# Patient Record
Sex: Female | Born: 1945 | Race: White | Hispanic: No | Marital: Married | Smoking: Never smoker
Health system: Southern US, Community
[De-identification: ages and names within clinical notes are randomized; demographics above are authoritative.]

## PROBLEM LIST (undated history)

## (undated) DIAGNOSIS — D329 Benign neoplasm of meninges, unspecified: Secondary | ICD-10-CM

## (undated) DIAGNOSIS — E785 Hyperlipidemia, unspecified: Secondary | ICD-10-CM

## (undated) DIAGNOSIS — IMO0001 Reserved for inherently not codable concepts without codable children: Secondary | ICD-10-CM

## (undated) DIAGNOSIS — B958 Unspecified staphylococcus as the cause of diseases classified elsewhere: Secondary | ICD-10-CM

## (undated) DIAGNOSIS — A0472 Enterocolitis due to Clostridium difficile, not specified as recurrent: Secondary | ICD-10-CM

## (undated) HISTORY — DX: Benign neoplasm of meninges, unspecified: D32.9

## (undated) HISTORY — DX: Hyperlipidemia, unspecified: E78.5

## (undated) HISTORY — DX: Unspecified staphylococcus as the cause of diseases classified elsewhere: B95.8

## (undated) HISTORY — PX: ATRIAL ABLATION SURGERY: SHX560

## (undated) HISTORY — PX: BREAST SURGERY: SHX581

## (undated) HISTORY — DX: Reserved for inherently not codable concepts without codable children: IMO0001

## (undated) HISTORY — DX: Enterocolitis due to Clostridium difficile, not specified as recurrent: A04.72

---

## 2005-04-13 ENCOUNTER — Encounter: Payer: Self-pay | Admitting: Family Medicine

## 2005-07-09 ENCOUNTER — Ambulatory Visit: Payer: Self-pay | Admitting: Family Medicine

## 2005-07-24 ENCOUNTER — Ambulatory Visit: Payer: Self-pay | Admitting: Family Medicine

## 2005-07-30 ENCOUNTER — Encounter: Payer: Self-pay | Admitting: Family Medicine

## 2005-09-10 DIAGNOSIS — IMO0001 Reserved for inherently not codable concepts without codable children: Secondary | ICD-10-CM

## 2005-09-10 HISTORY — DX: Reserved for inherently not codable concepts without codable children: IMO0001

## 2006-06-18 DIAGNOSIS — IMO0002 Reserved for concepts with insufficient information to code with codable children: Secondary | ICD-10-CM | POA: Insufficient documentation

## 2006-06-18 DIAGNOSIS — M1710 Unilateral primary osteoarthritis, unspecified knee: Secondary | ICD-10-CM

## 2006-06-18 DIAGNOSIS — G43909 Migraine, unspecified, not intractable, without status migrainosus: Secondary | ICD-10-CM | POA: Insufficient documentation

## 2006-07-08 ENCOUNTER — Encounter: Payer: Self-pay | Admitting: Family Medicine

## 2006-07-08 DIAGNOSIS — M8580 Other specified disorders of bone density and structure, unspecified site: Secondary | ICD-10-CM | POA: Insufficient documentation

## 2006-07-08 DIAGNOSIS — M949 Disorder of cartilage, unspecified: Secondary | ICD-10-CM

## 2006-07-08 DIAGNOSIS — E7800 Pure hypercholesterolemia, unspecified: Secondary | ICD-10-CM | POA: Insufficient documentation

## 2006-07-08 DIAGNOSIS — M899 Disorder of bone, unspecified: Secondary | ICD-10-CM

## 2006-08-13 ENCOUNTER — Encounter: Payer: Self-pay | Admitting: Family Medicine

## 2006-08-13 ENCOUNTER — Ambulatory Visit: Payer: Self-pay | Admitting: Family Medicine

## 2006-08-13 ENCOUNTER — Other Ambulatory Visit: Admission: RE | Admit: 2006-08-13 | Discharge: 2006-08-13 | Payer: Self-pay | Admitting: Family Medicine

## 2006-08-13 DIAGNOSIS — M25559 Pain in unspecified hip: Secondary | ICD-10-CM | POA: Insufficient documentation

## 2006-08-13 DIAGNOSIS — M461 Sacroiliitis, not elsewhere classified: Secondary | ICD-10-CM | POA: Insufficient documentation

## 2006-08-13 DIAGNOSIS — L659 Nonscarring hair loss, unspecified: Secondary | ICD-10-CM | POA: Insufficient documentation

## 2006-08-14 LAB — CONVERTED CEMR LAB
HDL: 55 mg/dL (ref >39)
LDL Cholesterol: 107 mg/dL — ABNORMAL HIGH (ref 0–99)
TSH: 1.587 microintl units/mL (ref 0.350–5.50)
Total CHOL/HDL Ratio: 3.4
Triglycerides: 126 mg/dL (ref <150)

## 2006-10-04 ENCOUNTER — Telehealth: Payer: Self-pay | Admitting: Family Medicine

## 2006-10-07 ENCOUNTER — Telehealth (INDEPENDENT_AMBULATORY_CARE_PROVIDER_SITE_OTHER): Payer: Self-pay | Admitting: *Deleted

## 2006-10-07 DIAGNOSIS — R928 Other abnormal and inconclusive findings on diagnostic imaging of breast: Secondary | ICD-10-CM | POA: Insufficient documentation

## 2006-10-22 ENCOUNTER — Encounter: Payer: Self-pay | Admitting: Family Medicine

## 2007-09-15 ENCOUNTER — Encounter: Payer: Self-pay | Admitting: Family Medicine

## 2007-09-15 ENCOUNTER — Encounter: Admission: RE | Admit: 2007-09-15 | Discharge: 2007-09-15 | Payer: Self-pay | Admitting: Family Medicine

## 2007-09-15 ENCOUNTER — Other Ambulatory Visit: Admission: RE | Admit: 2007-09-15 | Discharge: 2007-09-15 | Payer: Self-pay | Admitting: Family Medicine

## 2007-09-15 ENCOUNTER — Ambulatory Visit: Payer: Self-pay | Admitting: Family Medicine

## 2007-09-15 LAB — CONVERTED CEMR LAB
ALT: 30 units/L (ref 0–35)
AST: 24 units/L (ref 0–37)
BUN: 22 mg/dL (ref 6–23)
CO2: 19 meq/L (ref 19–32)
Calcium: 9.6 mg/dL (ref 8.4–10.5)
Chloride: 109 meq/L (ref 96–112)
Cholesterol: 174 mg/dL (ref 0–200)
Creatinine, Ser: 0.89 mg/dL (ref 0.40–1.20)
HDL: 55 mg/dL (ref >39)
Total Bilirubin: 0.5 mg/dL (ref 0.3–1.2)
Total CHOL/HDL Ratio: 3.2
VLDL: 22 mg/dL (ref 0–40)

## 2007-09-16 ENCOUNTER — Encounter: Payer: Self-pay | Admitting: Family Medicine

## 2007-12-08 ENCOUNTER — Telehealth: Payer: Self-pay | Admitting: Family Medicine

## 2008-10-10 ENCOUNTER — Encounter: Payer: Self-pay | Admitting: Family Medicine

## 2008-10-18 ENCOUNTER — Encounter: Payer: Self-pay | Admitting: Family Medicine

## 2008-10-26 ENCOUNTER — Encounter: Payer: Self-pay | Admitting: Family Medicine

## 2008-12-09 ENCOUNTER — Other Ambulatory Visit: Admission: RE | Admit: 2008-12-09 | Discharge: 2008-12-09 | Payer: Self-pay | Admitting: Family Medicine

## 2008-12-09 ENCOUNTER — Encounter: Payer: Self-pay | Admitting: Family Medicine

## 2008-12-09 ENCOUNTER — Ambulatory Visit: Payer: Self-pay | Admitting: Family Medicine

## 2008-12-09 DIAGNOSIS — D649 Anemia, unspecified: Secondary | ICD-10-CM | POA: Insufficient documentation

## 2008-12-13 ENCOUNTER — Telehealth: Payer: Self-pay | Admitting: Family Medicine

## 2008-12-13 ENCOUNTER — Encounter (INDEPENDENT_AMBULATORY_CARE_PROVIDER_SITE_OTHER): Payer: Self-pay | Admitting: *Deleted

## 2008-12-14 ENCOUNTER — Ambulatory Visit: Payer: Self-pay | Admitting: Family Medicine

## 2008-12-14 DIAGNOSIS — T148XXA Other injury of unspecified body region, initial encounter: Secondary | ICD-10-CM

## 2008-12-14 DIAGNOSIS — L089 Local infection of the skin and subcutaneous tissue, unspecified: Secondary | ICD-10-CM | POA: Insufficient documentation

## 2008-12-14 LAB — CONVERTED CEMR LAB
ALT: 12 units/L (ref 0–35)
AST: 15 units/L (ref 0–37)
Alkaline Phosphatase: 86 units/L (ref 39–117)
Cholesterol: 141 mg/dL (ref 0–200)
Creatinine, Ser: 0.71 mg/dL (ref 0.40–1.20)
HCT: 35.7 % — ABNORMAL LOW (ref 36.0–46.0)
LDL Cholesterol: 73 mg/dL (ref 0–99)
MCHC: 30.3 g/dL (ref 30.0–36.0)
MCV: 90.4 fL (ref 78.0–100.0)
Platelets: 631 10*3/uL — ABNORMAL HIGH (ref 150–400)
RDW: 16 % — ABNORMAL HIGH (ref 11.5–15.5)
Sodium: 142 meq/L (ref 135–145)
Total Bilirubin: 0.3 mg/dL (ref 0.3–1.2)
Total CHOL/HDL Ratio: 3.4
Total Protein: 7.4 g/dL (ref 6.0–8.3)
VLDL: 27 mg/dL (ref 0–40)
Vit D, 25-Hydroxy: 59 ng/mL (ref 30–89)
WBC: 8 10*3/uL (ref 4.0–10.5)

## 2008-12-20 ENCOUNTER — Encounter: Payer: Self-pay | Admitting: Family Medicine

## 2008-12-30 ENCOUNTER — Encounter: Payer: Self-pay | Admitting: Family Medicine

## 2009-05-19 ENCOUNTER — Encounter (INDEPENDENT_AMBULATORY_CARE_PROVIDER_SITE_OTHER): Payer: Self-pay | Admitting: *Deleted

## 2009-07-08 ENCOUNTER — Encounter: Payer: Self-pay | Admitting: Family Medicine

## 2010-01-02 ENCOUNTER — Telehealth: Payer: Self-pay | Admitting: Family Medicine

## 2010-01-04 ENCOUNTER — Ambulatory Visit: Payer: Self-pay | Admitting: Family Medicine

## 2010-01-04 ENCOUNTER — Encounter: Payer: Self-pay | Admitting: Family Medicine

## 2010-01-04 DIAGNOSIS — K3189 Other diseases of stomach and duodenum: Secondary | ICD-10-CM | POA: Insufficient documentation

## 2010-01-04 DIAGNOSIS — R1013 Epigastric pain: Secondary | ICD-10-CM

## 2010-01-04 DIAGNOSIS — R635 Abnormal weight gain: Secondary | ICD-10-CM | POA: Insufficient documentation

## 2010-01-05 LAB — CONVERTED CEMR LAB
ALT: 16 units/L (ref 0–35)
AST: 18 units/L (ref 0–37)
Calcium: 9.8 mg/dL (ref 8.4–10.5)
Chloride: 109 meq/L (ref 96–112)
Creatinine, Ser: 0.93 mg/dL (ref 0.40–1.20)
Eosinophils Absolute: 0.4 10*3/uL (ref 0.0–0.7)
Lymphs Abs: 2.7 10*3/uL (ref 0.7–4.0)
MCV: 93.6 fL (ref 78.0–100.0)
Neutro Abs: 2.8 10*3/uL (ref 1.7–7.7)
Neutrophils Relative %: 41 % — ABNORMAL LOW (ref 43–77)
Platelets: 487 10*3/uL — ABNORMAL HIGH (ref 150–400)
RBC: 4.38 M/uL (ref 3.87–5.11)
Sodium: 141 meq/L (ref 135–145)
TSH: 2.27 microintl units/mL (ref 0.350–4.500)
Total Bilirubin: 0.3 mg/dL (ref 0.3–1.2)
Total CHOL/HDL Ratio: 3.5
Total Protein: 6.7 g/dL (ref 6.0–8.3)
VLDL: 25 mg/dL (ref 0–40)
Vit D, 25-Hydroxy: 65 ng/mL (ref 30–89)
WBC: 6.9 10*3/uL (ref 4.0–10.5)

## 2010-02-08 ENCOUNTER — Encounter: Payer: Self-pay | Admitting: Family Medicine

## 2010-06-13 ENCOUNTER — Ambulatory Visit: Payer: Self-pay | Admitting: Family Medicine

## 2010-06-14 ENCOUNTER — Encounter: Payer: Self-pay | Admitting: Family Medicine

## 2010-07-13 ENCOUNTER — Encounter: Payer: Self-pay | Admitting: Family Medicine

## 2010-07-24 ENCOUNTER — Encounter: Payer: Self-pay | Admitting: Family Medicine

## 2010-08-01 ENCOUNTER — Encounter: Payer: Self-pay | Admitting: Family Medicine

## 2010-08-13 ENCOUNTER — Encounter: Payer: Self-pay | Admitting: Family Medicine

## 2010-09-21 ENCOUNTER — Encounter: Payer: Self-pay | Admitting: Family Medicine

## 2010-10-05 ENCOUNTER — Encounter: Payer: Self-pay | Admitting: Family Medicine

## 2010-10-06 ENCOUNTER — Encounter: Payer: Self-pay | Admitting: Family Medicine

## 2010-10-10 ENCOUNTER — Encounter: Payer: Self-pay | Admitting: Family Medicine

## 2010-10-10 NOTE — Progress Notes (Signed)
Summary: pt. questions and needs a call back   Phone Note Call from Patient Call back at Home Phone [phone]   Caller: Patient Summary of Call: pt. is wondering if she needs blood work to check cholesterol or does she need to see the Dr. too.?? She also needs a  Rx. Lipitor 40mg ... Call her back and let her know what needs to scheduled. Thank you, Victorino Dike rich Initial call taken by: Michaelle Copas,  January 02, 2010 9:59 AM  Follow-up for Phone Call        due for office visit AND fasting labs. Follow-up by: Seymour Bars DO,  January 02, 2010 10:15 AM

## 2010-10-10 NOTE — Procedures (Signed)
Summary: [facility]  Upper [facility] Endoscopy/[facility]   Imported By: Lanelle Bal 08/07/2010 10:00:13  _____________________________________________________________________  External Attachment:    Type:   Image     Comment:   External Document
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
## 2010-10-10 NOTE — Consult Note (Signed)
Summary: [facility]   Imported By: Lanelle Bal 07/28/2010 08:28:13  _____________________________________________________________________  External Attachment:    Type:   Image     Comment:   External Document

## 2010-10-10 NOTE — Miscellaneous (Signed)
Summary: mammogram normal  Clinical Lists Changes  Observations: Added new observation of MAMMRECACT: Screening mammogram in 1 year.    (02/07/2010 16:55) Added new observation of MAMMOGRAM: No significant changes compared to previous study.  Assessment: BIRADS 2. Location: [facility].    (02/07/2010 16:55)      Mammogram  Procedure date:  02/07/2010  Findings:      No significant changes compared to previous study.  Assessment: BIRADS 2. Location: [facility].     Comments:      Screening mammogram in 1 year.      Mammogram  Procedure date:  02/07/2010  Findings:      No significant changes compared to previous study.  Assessment: BIRADS 2. Location: [facility].     Comments:      Screening mammogram in 1 year.
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
## 2010-10-11 HISTORY — PX: VENTRAL HERNIA REPAIR: SHX424

## 2010-10-12 NOTE — Letter (Signed)
Summary: [facility]  [facility]   Imported By: Lanelle Bal 09/29/2010 11:11:32  _____________________________________________________________________  External Attachment:    Type:   Image     Comment:   External Document
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
## 2010-10-12 NOTE — Consult Note (Signed)
Summary: [facility]   Imported By: Lanelle Bal 10/05/2010 11:22:52  _____________________________________________________________________  External Attachment:    Type:   Image     Comment:   External Document

## 2010-10-18 NOTE — Miscellaneous (Signed)
Summary: stress echo: normal  Clinical Lists Changes  Observations: Added new observation of ETTECHOFIND: done at [facility] cardiology  normal.  no inducible ischemia. METS 9 LV normal with EF 55-60%. mild aortic sclerosis with good valvular opening.  (10/05/2010 10:56)      Stress Echocardiogram  Procedure date:  10/05/2010  Findings:      done at [facility] cardiology  normal.  no inducible ischemia. METS 9 LV normal with EF 55-60%. mild aortic sclerosis with good valvular opening.    Stress Echocardiogram  Procedure date:  10/05/2010  Findings:      done at [facility] cardiology  normal.  no inducible ischemia. METS 9 LV normal with EF 55-60%. mild aortic sclerosis with good valvular opening.   Appended Document: stress echo: normal Pls make sure pt knows that her stress echo came back normal.  She does not have any sign of coronary artery dz and has excellent pumping function and exercise capacity.  Seymour Bars, D.O.  Appended Document: stress echo: normal LM w/ husband. Pt will call back w/ any questions.

## 2010-10-31 ENCOUNTER — Encounter: Payer: Self-pay | Admitting: Family Medicine

## 2010-11-01 NOTE — Letter (Signed)
Summary: [facility]   [facility]   Imported By: Kassie Mends 10/26/2010 08:53:48  _____________________________________________________________________  External Attachment:    Type:   Image     Comment:   External Document

## 2010-11-28 NOTE — Letter (Signed)
Summary: [facility]   Imported By: Maryln Gottron 11/22/2010 10:44:22  _____________________________________________________________________  External Attachment:    Type:   Image     Comment:   External Document

## 2010-12-18 ENCOUNTER — Ambulatory Visit: Payer: Self-pay | Admitting: Family Medicine

## 2011-01-01 ENCOUNTER — Ambulatory Visit (INDEPENDENT_AMBULATORY_CARE_PROVIDER_SITE_OTHER): Payer: No Typology Code available for payment source | Admitting: Family Medicine

## 2011-01-01 ENCOUNTER — Encounter: Payer: Self-pay | Admitting: Family Medicine

## 2011-01-01 VITALS — BP 126/80 | HR 88 | Ht 64.0 in | Wt 145.0 lb

## 2011-01-01 DIAGNOSIS — J309 Allergic rhinitis, unspecified: Secondary | ICD-10-CM | POA: Insufficient documentation

## 2011-01-01 MED ORDER — CETIRIZINE-PSEUDOEPHEDRINE ER 5-120 MG PO TB12: 1.0000 | ORAL_TABLET | Freq: Two times a day (BID) | ORAL | Status: DC

## 2011-01-01 MED ORDER — PREDNISONE 20 MG PO TABS: ORAL_TABLET | ORAL | Status: DC

## 2011-01-01 NOTE — Assessment & Plan Note (Signed)
Findings c/w allergic rhinitis with flare up.  Will treat flare with Prednisone 40 mg/ day x 5 days + Zyrtec D for spring allergies.  Call if any problems.

## 2011-01-01 NOTE — Patient Instructions (Signed)
For allergies: take Prednisone 40 mg once daily (in the morning) for 5 days.  Also, stay on an allergy medicine such as Zyrtec D twice a day from now thru the end of Spring (end of May)

## 2011-01-01 NOTE — Progress Notes (Signed)
  Subjective:    Patient ID: Virginia Williams, female    DOB: 1946-01-12, 65 y.o.   MRN: [id number]  HPI  65 yo WF presents for 3 wks of congestion with rhinorrhea.  She has never had allergies.  Denies fevers, chills, sinus pressure or HAs.  Appetite has dropped.  She also has a dry cough.  No SOB or chest tightness.  No sputum production.  She has tried to take Contact Cold but it didn't help.  She [facility] to [facility] and was given an antibiotic but it didn't help.  She has started Claritin which seemed to help some.    BP 126/80  Pulse 88  Ht 5\' 4"  (1.626 m)  Wt 145 lb (65.772 kg)  BMI 24.89 kg/m2  SpO2 96%  Review of Systems  Constitutional: Negative for fever, chills, appetite change and fatigue.  HENT: Positive for congestion, rhinorrhea, sneezing and postnasal drip. Negative for ear pain and sinus pressure.   Respiratory: Positive for cough. Negative for chest tightness, shortness of breath and wheezing.   Cardiovascular: Negative for chest pain.  Gastrointestinal: Negative for nausea.  Neurological: Negative for headaches.       Objective:   Physical Exam  Constitutional: She appears well-developed and well-nourished. No distress.  HENT:  Head: Normocephalic and atraumatic.       Sinuses NTTP  Eyes: Conjunctivae are normal. Right eye exhibits no discharge. Left eye exhibits no discharge.  Neck: Neck supple.  Cardiovascular: Normal rate, regular rhythm and normal heart sounds.   Pulmonary/Chest: Effort normal and breath sounds normal. She has no wheezes.  Lymphadenopathy:    She has no cervical adenopathy.  Skin: Skin is warm and dry.  Psychiatric: She has a normal mood and affect.          Assessment & Plan:

## 2011-03-08 ENCOUNTER — Other Ambulatory Visit: Payer: Self-pay | Admitting: Family Medicine

## 2011-04-02 ENCOUNTER — Telehealth: Payer: Self-pay | Admitting: Family Medicine

## 2011-04-02 NOTE — Telephone Encounter (Signed)
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

## 2011-04-05 NOTE — Telephone Encounter (Signed)
Called patient and left her a message at her home number to call back and schedule a f/u appointment re: her CT. Thanks, [employer]

## 2011-04-10 ENCOUNTER — Encounter: Payer: Self-pay | Admitting: Family Medicine

## 2011-04-10 ENCOUNTER — Ambulatory Visit (INDEPENDENT_AMBULATORY_CARE_PROVIDER_SITE_OTHER): Payer: Medicare Other | Admitting: Family Medicine

## 2011-04-10 VITALS — BP 124/74 | HR 82 | Ht 63.0 in | Wt 141.0 lb

## 2011-04-10 DIAGNOSIS — E785 Hyperlipidemia, unspecified: Secondary | ICD-10-CM

## 2011-04-10 DIAGNOSIS — I712 Thoracic aortic aneurysm, without rupture: Secondary | ICD-10-CM

## 2011-04-10 DIAGNOSIS — I77810 Thoracic aortic ectasia: Secondary | ICD-10-CM

## 2011-04-10 NOTE — Progress Notes (Signed)
  Subjective:    Patient ID: Virginia Williams, female    DOB: [birth year], 65 y.o.   MRN: [id number]  HPI  65 yo WF presents for f/u of a CT of the chest done thru the [facility] study recently.  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  She has never been a smoker.  She has high cholesterol on lipitor.  Her last LDL was under 130.  She has never had HTN or a fam hx of aneursym. Denies any CP or DOE.  She is due to f/u with Dr Mindi Curling, her cardiologist per her report, though has never had any cardiac problems.    BP 124/74  Pulse 82  Ht 5\' 3"  (1.6 m)  Wt 141 lb (63.957 kg)  BMI 24.98 kg/m2  SpO2 97%   Review of Systems  Respiratory: Negative for shortness of breath.   Cardiovascular: Negative for chest pain, palpitations and leg swelling.  Musculoskeletal: Negative for back pain.  Psychiatric/Behavioral: The patient is nervous/anxious.        Objective:   Physical Exam  Constitutional: She appears well-developed and well-nourished.  HENT:  Mouth/Throat: Oropharynx is clear and moist.  Neck: Neck supple. No thyromegaly present.  Cardiovascular: Normal rate, regular rhythm and normal heart sounds.   No murmur heard. Pulmonary/Chest: Effort normal and breath sounds normal. No respiratory distress. She has no rales.  Abdominal: Soft. Bowel sounds are normal. There is no tenderness.       Midline incision healing nicely  Musculoskeletal: She exhibits no edema.  Skin: Skin is warm and dry.  Psychiatric: She has a normal mood and affect.          Assessment & Plan:   No problem-specific assessment & plan notes found for this encounter.

## 2011-04-10 NOTE — Patient Instructions (Signed)
Update fasting labs one morning. Will call you w/ results.  Will get u/s of aorta and echo done at [facility] and will get you f/u with Dr Mindi Curling.

## 2011-04-11 DIAGNOSIS — I77810 Thoracic aortic ectasia: Secondary | ICD-10-CM | POA: Insufficient documentation

## 2011-04-11 LAB — LIPID PANEL
LDL Cholesterol: 109 mg/dL — ABNORMAL HIGH (ref 0–99)
VLDL: 20 mg/dL (ref 0–40)

## 2011-04-11 NOTE — Assessment & Plan Note (Signed)
This was found incidentally on a CT of the chest performed by the [facility] research study.  Her only risk factor for development of aneurysm is high cholesterol which is well managed on Lipitor.  D/w pt the plan to get an ECHO and US of the ascending aorta to see if this is really anything to worry about and then get her a f/u appt with Dr Mindi Curling.  Will go ahead and update her full set of fasting labs.

## 2011-04-12 ENCOUNTER — Telehealth: Payer: Self-pay | Admitting: Family Medicine

## 2011-04-12 LAB — COMPLETE METABOLIC PANEL WITH GFR
ALT: 12 U/L (ref 0–35)
AST: 19 U/L (ref 0–37)
Albumin: 4.5 g/dL (ref 3.5–5.2)
Calcium: 9.6 mg/dL (ref 8.4–10.5)
Chloride: 110 mEq/L (ref 96–112)
Potassium: 4.8 mEq/L (ref 3.5–5.3)
Total Protein: 6.9 g/dL (ref 6.0–8.3)

## 2011-04-12 NOTE — Telephone Encounter (Signed)
Pt aware.

## 2011-04-12 NOTE — Telephone Encounter (Signed)
Pls let pt know that her echocardiogram shows mild leaking of the aortic valve and some imparied relaxation of the L ventricular wall, o/w normal.  Continue to monitor for BP/ cholesterol problems.  Fax copy to Dr Mindi Curling for upcoming visit.

## 2011-04-12 NOTE — Telephone Encounter (Signed)
Please call pt.  Chemistry panel shows a normal fasting sugar, liver and kidney function with excellent cholesterol results.  Repeat in 1 yr.  Fax copy to Dr Mindi Curling, her cardiologist..

## 2011-04-12 NOTE — Telephone Encounter (Signed)
LMOM informing Pt  

## 2011-04-17 ENCOUNTER — Telehealth: Payer: Self-pay | Admitting: Family Medicine

## 2011-04-17 NOTE — Telephone Encounter (Signed)
Pls let pt know that her duplex of aorta came back completely normal.  There is no finding of aneurysm.

## 2011-04-17 NOTE — Telephone Encounter (Signed)
Pt notified of results. KJ LPN 

## 2011-04-19 ENCOUNTER — Encounter: Payer: Self-pay | Admitting: Family Medicine

## 2011-04-25 ENCOUNTER — Encounter: Payer: Self-pay | Admitting: Family Medicine

## 2011-05-07 ENCOUNTER — Other Ambulatory Visit: Payer: Self-pay | Admitting: Family Medicine

## 2011-05-29 ENCOUNTER — Encounter: Payer: Self-pay | Admitting: Emergency Medicine

## 2011-05-29 ENCOUNTER — Inpatient Hospital Stay (INDEPENDENT_AMBULATORY_CARE_PROVIDER_SITE_OTHER)
Admission: RE | Admit: 2011-05-29 | Discharge: 2011-05-29 | Disposition: A | Discharge status: Home / Self Care | Payer: Medicare Other | Source: Ambulatory Visit | Attending: Emergency Medicine | Admitting: Emergency Medicine

## 2011-05-29 DIAGNOSIS — M62838 Other muscle spasm: Secondary | ICD-10-CM

## 2011-05-29 DIAGNOSIS — R29818 Other symptoms and signs involving the nervous system: Secondary | ICD-10-CM

## 2011-06-15 ENCOUNTER — Telehealth (INDEPENDENT_AMBULATORY_CARE_PROVIDER_SITE_OTHER): Payer: Self-pay | Admitting: Emergency Medicine

## 2011-07-30 ENCOUNTER — Telehealth: Payer: Self-pay | Admitting: *Deleted

## 2011-07-30 NOTE — Telephone Encounter (Signed)
Pt calls stating she feels light headed and dizzy occasionally x 2 months. I explained that this could be caused by many different issue and she needs OV to discuss. If Sxs worsen before apt Pt instructed to go to UC or ED. Pt agreed

## 2011-08-13 ENCOUNTER — Encounter: Payer: Self-pay | Admitting: Family Medicine

## 2011-08-13 ENCOUNTER — Ambulatory Visit (INDEPENDENT_AMBULATORY_CARE_PROVIDER_SITE_OTHER): Payer: Medicare Other | Admitting: Family Medicine

## 2011-08-13 VITALS — BP 106/58 | HR 84 | Wt 141.0 lb

## 2011-08-13 DIAGNOSIS — R1013 Epigastric pain: Secondary | ICD-10-CM

## 2011-08-13 DIAGNOSIS — R002 Palpitations: Secondary | ICD-10-CM

## 2011-08-13 DIAGNOSIS — R42 Dizziness and giddiness: Secondary | ICD-10-CM

## 2011-08-13 DIAGNOSIS — R0789 Other chest pain: Secondary | ICD-10-CM

## 2011-08-13 MED ORDER — CYCLOBENZAPRINE HCL 10 MG PO TABS: 10.0000 mg | ORAL_TABLET | Freq: Every evening | ORAL | Status: AC | PRN

## 2011-08-13 NOTE — Patient Instructions (Signed)
If neck not better in 2-3 weeks will consider xrays to eval for arthritis.  Try the samples for stomach reflux. Take before breakfast and if not better in one week then recommend go back to see your general surgeon.  Try the muscle at bedtime for your neck and try gentle stretches after applying heat. Cervical Sprain and Strain A cervical sprain is an injury to the neck. The injury can include either over-stretching or even small tears in the ligaments that hold the bones of the neck in place. A strain affects muscles and tendons. Minor injuries usually only involve ligaments and muscles. Because the different parts of the neck are so close together, more severe injuries can involve both sprain and strain. These injuries can affect the muscles, ligaments, tendons, discs, and nerves in the neck. CAUSES   An injury may be the result of a direct blow or from certain habits that can lead to the symptoms noted above.  Injury from:     Contact sports (such as football, rugby, wrestling, hockey, auto racing, gymnastics, diving, martial arts, and boxing).     Motor vehicle accidents.     Whiplash injuries (see image at right). These are common. They occur when the neck is forcefully whipped or forced backward and/or forward.     Falls.    Lifestyle or awkward postures:     Cradling a telephone between the ear and shoulder.     Sitting in a chair that offers no support.     Working at an Theme park manager station.     Activities that require hours of repeated or long periods of looking up (stretching the neck backward) or looking down (bending the head/neck forward).  SYMPTOMS    Pain, soreness, stiffness, or burning sensation in the front, back, or sides of the neck. This may develop immediately after injury. Onset of discomfort may also develop slowly and not begin for 24 hours or more.     Shoulder and/or upper back pain.     Limits to the normal movement of the neck.     Headache.      Dizziness.    Weakness and/or abnormal sensation (such as numbness or tingling) of one or both arms and/or hands.     Muscle spasm.     Difficulty with swallowing or chewing.     Tenderness and swelling at the injury site.  DIAGNOSIS   Most of the time, your caregiver can diagnose this problem with a careful history and examination. The history will include information about known problems (such as arthritis in the neck) or a previous neck injury. X-rays may be ordered to find out if there is a different problem. X-rays can also help to find problems with the bones of the neck not related to the injury or current symptoms. TREATMENT   Several treatment options are available to help pain, spasm, and other symptoms. They include:  Cold helps relieve pain and reduce inflammation. Cold should be applied for 10 to 15 minutes every 2 to 3 hours after any activity that aggravates your symptoms. Use ice packs or an ice massage. Place a towel or cloth in between your skin and the ice pack.     Medication:    Only take over-the-counter or prescription medicines for pain, discomfort, or fever as directed by your caregiver.     Pain relievers or muscle relaxants may be prescribed. Use only as directed and only as much as you need.  Change in the activity that caused the problem. This might include using a headset with a telephone so that the phone is not propped between your ear and shoulder.     Neck collar. Your caregiver may recommend temporary use of a soft cervical collar.     Work station. Changes may be needed in your work place. A better sitting position and/or better posture during work may be part of your treatment.     Physical Therapy. Your caregiver may recommend physical therapy. This can include instructions in the use of stretching and strengthening exercises. Improvement in posture is important. Exercises and posture training can help stabilize the neck and strengthen muscles  and keep symptoms from returning.  HOME CARE INSTRUCTIONS   Other than formal physical therapy, all treatments above can be done at home. Even when not at work, it is important to be conscious of your posture and of activities that can cause a return of symptoms. Most cervical sprains and/or strains are better in 1-3 weeks. As you improve and increase activities, doing a warm up and stretching before the activity will help prevent recurrent problems. SEEK MEDICAL CARE IF:    Pain is not effectively controlled with medication.     You feel unable to decrease pain medication over time as planned.     Activity level is not improving as planned and/or expected.  SEEK IMMEDIATE MEDICAL CARE IF:    While using medication, you develop any bleeding, stomach upset, or signs of an allergic reaction.     Symptoms get worse, become intolerable, and are not helped by medications.     New, unexplained symptoms develop.     You experience numbness, tingling, weakness, or paralysis of any part of your body.  MAKE SURE YOU:    Understand these instructions.     Will watch your condition.     Will get help right away if you are not doing well or get worse.  Document Released: 06/24/2007 Document Revised: 05/09/2011 Document Reviewed: 06/24/2007 [facility] Patient Information 2012 [place], [place].Cervical Sprain and Strain A cervical sprain is an injury to the neck. The injury can include either over-stretching or even small tears in the ligaments that hold the bones of the neck in place. A strain affects muscles and tendons. Minor injuries usually only involve ligaments and muscles. Because the different parts of the neck are so close together, more severe injuries can involve both sprain and strain. These injuries can affect the muscles, ligaments, tendons, discs, and nerves in the neck. CAUSES   An injury may be the result of a direct blow or from certain habits that can lead to the symptoms noted  above.  Injury from:     Contact sports (such as football, rugby, wrestling, hockey, auto racing, gymnastics, diving, martial arts, and boxing).     Motor vehicle accidents.     Whiplash injuries (see image at right). These are common. They occur when the neck is forcefully whipped or forced backward and/or forward.     Falls.    Lifestyle or awkward postures:     Cradling a telephone between the ear and shoulder.     Sitting in a chair that offers no support.     Working at an Theme park manager station.     Activities that require hours of repeated or long periods of looking up (stretching the neck backward) or looking down (bending the head/neck forward).  SYMPTOMS    Pain, soreness, stiffness, or burning  sensation in the front, back, or sides of the neck. This may develop immediately after injury. Onset of discomfort may also develop slowly and not begin for 24 hours or more.     Shoulder and/or upper back pain.     Limits to the normal movement of the neck.     Headache.    Dizziness.    Weakness and/or abnormal sensation (such as numbness or tingling) of one or both arms and/or hands.     Muscle spasm.     Difficulty with swallowing or chewing.     Tenderness and swelling at the injury site.  DIAGNOSIS   Most of the time, your caregiver can diagnose this problem with a careful history and examination. The history will include information about known problems (such as arthritis in the neck) or a previous neck injury. X-rays may be ordered to find out if there is a different problem. X-rays can also help to find problems with the bones of the neck not related to the injury or current symptoms. TREATMENT   Several treatment options are available to help pain, spasm, and other symptoms. They include:  Cold helps relieve pain and reduce inflammation. Cold should be applied for 10 to 15 minutes every 2 to 3 hours after any activity that aggravates your symptoms. Use  ice packs or an ice massage. Place a towel or cloth in between your skin and the ice pack.     Medication:    Only take over-the-counter or prescription medicines for pain, discomfort, or fever as directed by your caregiver.     Pain relievers or muscle relaxants may be prescribed. Use only as directed and only as much as you need.     Change in the activity that caused the problem. This might include using a headset with a telephone so that the phone is not propped between your ear and shoulder.     Neck collar. Your caregiver may recommend temporary use of a soft cervical collar.     Work station. Changes may be needed in your work place. A better sitting position and/or better posture during work may be part of your treatment.     Physical Therapy. Your caregiver may recommend physical therapy. This can include instructions in the use of stretching and strengthening exercises. Improvement in posture is important. Exercises and posture training can help stabilize the neck and strengthen muscles and keep symptoms from returning.  HOME CARE INSTRUCTIONS   Other than formal physical therapy, all treatments above can be done at home. Even when not at work, it is important to be conscious of your posture and of activities that can cause a return of symptoms. Most cervical sprains and/or strains are better in 1-3 weeks. As you improve and increase activities, doing a warm up and stretching before the activity will help prevent recurrent problems. SEEK MEDICAL CARE IF:    Pain is not effectively controlled with medication.     You feel unable to decrease pain medication over time as planned.     Activity level is not improving as planned and/or expected.  SEEK IMMEDIATE MEDICAL CARE IF:    While using medication, you develop any bleeding, stomach upset, or signs of an allergic reaction.     Symptoms get worse, become intolerable, and are not helped by medications.     New, unexplained  symptoms develop.     You experience numbness, tingling, weakness, or paralysis of any part of your body.  MAKE  SURE YOU:    Understand these instructions.     Will watch your condition.     Will get help right away if you are not doing well or get worse.  Document Released: 06/24/2007 Document Revised: 05/09/2011 Document Reviewed: 06/24/2007 [facility] Patient Information 2012 [place], [place].

## 2011-08-13 NOTE — Progress Notes (Signed)
Subjective:    Patient ID: Virginia Williams, female    DOB: 06-Aug-1946, 64 y.o.   MRN: [id number]  HPI Says will get suddently dizzy and will get some chest discomfot as well. She denies any true vertigo. Will get palpitations with the chest discomfort. It only last for a few minutes..  Then her vision become white.  Says can happen at rest. Started to happen with driving as well. Occ feels SOB. This started about 2 months ago. No hx of DM but thought cold be sugars. No prior hx of heart problems. Recently saw Dr. Mindi Curling ( cards) and had a normal stress test earlier this year. Denies room spinning. No ear pain or fever or recent URI.  Have has been having some problems in her neck. Has been more stiff.  Does have a hx of migraines but never presented like this before. Sees Dr. Loleta Chance (s/p meningonoma removed).   Hx of GI ulcers. She had surgery back in February to repair an abdominal wall hernia. A mesh was placed. She says ever since the surgery she's had difficulty with pain in the epigastric area after eating. It typically occurs about 20-30 minutes after eating. It has not gotten better. She never followed back up with a surgeon to see if this could be a problem with the surgery itself. She is not currently taking any proton pump inhibitors or H2 blockers. She has tried changing her diet and has not made a difference.  Review of Systems BP 106/58  Pulse 84  Wt 141 lb (63.957 kg)    No Known Allergies  No past medical history on file.  No past surgical history on file.  History   Social History  . Marital Status: Married    Spouse Name: N/A    Number of Children: N/A  . Years of Education: N/A   Occupational History  . Not on file.   Social History Main Topics  . Smoking status: Never Smoker   . Smokeless tobacco: Not on file  . Alcohol Use: Not on file  . Drug Use: Not on file  . Sexually Active: Not on file   Other Topics Concern  . Not on file   Social History Narrative    . No narrative on file    No family history on file.     Objective:   Physical Exam  Constitutional: She is oriented to person, place, and time. She appears well-developed and well-nourished.  HENT:  Head: Normocephalic and atraumatic.  Neck: Neck supple. No thyromegaly present.  Cardiovascular: Normal rate, regular rhythm and normal heart sounds.        No carotid bruits.   Pulmonary/Chest: Effort normal and breath sounds normal.  Musculoskeletal:       Neck with Normal flexion. Dec extension, Normal rotation right and left and side bending tenderness at the base of the neck about C4-C5 area. UE with NROM and strenght 5/5. Reflexes in UE and LE is 2+ bilat.   Lymphadenopathy:    She has no cervical adenopathy.  Neurological: She is alert and oriented to person, place, and time.  Skin: Skin is warm and dry.  Psychiatric: She has a normal mood and affect. Her behavior is normal.          Assessment & Plan:  Palpitations- Will get EKG today. Shows rate of 76 bpm, no acute changes.  This EKG is reassuring. If the sensation persists I would like her to see her cardiologist Dr. Suzette Battiest,  though she had a stress test within the last year and evidently that was normal.  Abdominal pain-Had surgery for abominal mesh repair. Hx of GI ulcers. Ever since surgery in FEb she has had difficulty eating. Say will have pain about 20-30 min after she eats. Will start a PPI and if not better in one week then I recommend she go back to see her surgery to make sure no problems with her mesh or surgical adhensions that are causing a problem.   Neck Pain - Given mluscle relaxer at beditme and exercises to stretch out the area. If not better in 2-3 weeks will consider xrays to eval for arthritis. Call if she feels it is getting worse.  Dizziness-unclear etiology. Her ear exam is normal. She denies actual vertigo. I asked her if it feels like she's going to pass out and she says no. I seriously doubt this  is a recurrence of her meningioma but this is a consideration. I would like to work on her neck pain as well and see if this could be contributing.
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
## 2011-08-14 LAB — COMPLETE METABOLIC PANEL WITH GFR
ALT: 15 U/L (ref 0–35)
CO2: 28 mEq/L (ref 19–32)
Creat: 0.91 mg/dL (ref 0.50–1.10)
GFR, Est African American: 76 mL/min
GFR, Est Non African American: 66 mL/min
Total Bilirubin: 0.4 mg/dL (ref 0.3–1.2)

## 2011-08-14 LAB — CBC WITH DIFFERENTIAL/PLATELET
Eosinophils Absolute: 0.2 10*3/uL (ref 0.0–0.7)
Eosinophils Relative: 2 % (ref 0–5)
Lymphs Abs: 3.7 10*3/uL (ref 0.7–4.0)
MCH: 29.5 pg (ref 26.0–34.0)
MCHC: 31.2 g/dL (ref 30.0–36.0)
MCV: 94.4 fL (ref 78.0–100.0)
Platelets: 433 10*3/uL — ABNORMAL HIGH (ref 150–400)
RBC: 4.48 MIL/uL (ref 3.87–5.11)
RDW: 13.9 % (ref 11.5–15.5)

## 2011-08-16 ENCOUNTER — Encounter: Payer: Self-pay | Admitting: Family Medicine

## 2011-08-24 ENCOUNTER — Other Ambulatory Visit: Payer: Self-pay | Admitting: *Deleted

## 2011-08-24 MED ORDER — DEXLANSOPRAZOLE 60 MG PO CPDR: 60.0000 mg | DELAYED_RELEASE_CAPSULE | Freq: Every day | ORAL | Status: DC

## 2011-08-24 MED ORDER — ATORVASTATIN CALCIUM 40 MG PO TABS: 40.0000 mg | ORAL_TABLET | Freq: Every day | ORAL | Status: DC

## 2011-10-31 DIAGNOSIS — Z124 Encounter for screening for malignant neoplasm of cervix: Secondary | ICD-10-CM | POA: Diagnosis not present

## 2011-10-31 DIAGNOSIS — Z01419 Encounter for gynecological examination (general) (routine) without abnormal findings: Secondary | ICD-10-CM | POA: Diagnosis not present

## 2011-11-15 ENCOUNTER — Other Ambulatory Visit: Payer: Self-pay | Admitting: Family Medicine

## 2011-11-15 DIAGNOSIS — E785 Hyperlipidemia, unspecified: Secondary | ICD-10-CM | POA: Insufficient documentation

## 2011-11-15 DIAGNOSIS — R002 Palpitations: Secondary | ICD-10-CM | POA: Insufficient documentation

## 2011-11-15 DIAGNOSIS — R000 Tachycardia, unspecified: Secondary | ICD-10-CM | POA: Insufficient documentation

## 2011-11-15 DIAGNOSIS — R55 Syncope and collapse: Secondary | ICD-10-CM | POA: Diagnosis not present

## 2011-11-22 DIAGNOSIS — R000 Tachycardia, unspecified: Secondary | ICD-10-CM | POA: Diagnosis not present

## 2011-11-22 DIAGNOSIS — R55 Syncope and collapse: Secondary | ICD-10-CM | POA: Diagnosis not present

## 2011-11-22 DIAGNOSIS — R002 Palpitations: Secondary | ICD-10-CM | POA: Diagnosis not present

## 2011-11-22 DIAGNOSIS — E785 Hyperlipidemia, unspecified: Secondary | ICD-10-CM | POA: Diagnosis not present

## 2011-11-28 DIAGNOSIS — H2510 Age-related nuclear cataract, unspecified eye: Secondary | ICD-10-CM | POA: Diagnosis not present

## 2011-12-05 DIAGNOSIS — G43009 Migraine without aura, not intractable, without status migrainosus: Secondary | ICD-10-CM | POA: Diagnosis not present

## 2011-12-05 DIAGNOSIS — M542 Cervicalgia: Secondary | ICD-10-CM | POA: Diagnosis not present

## 2011-12-19 DIAGNOSIS — M542 Cervicalgia: Secondary | ICD-10-CM | POA: Diagnosis not present

## 2011-12-27 DIAGNOSIS — I491 Atrial premature depolarization: Secondary | ICD-10-CM | POA: Diagnosis not present

## 2011-12-27 DIAGNOSIS — E785 Hyperlipidemia, unspecified: Secondary | ICD-10-CM | POA: Diagnosis not present

## 2011-12-27 DIAGNOSIS — R55 Syncope and collapse: Secondary | ICD-10-CM | POA: Diagnosis not present

## 2011-12-28 ENCOUNTER — Encounter: Payer: Self-pay | Admitting: Family Medicine

## 2011-12-28 DIAGNOSIS — M542 Cervicalgia: Secondary | ICD-10-CM | POA: Diagnosis not present

## 2011-12-28 DIAGNOSIS — I491 Atrial premature depolarization: Secondary | ICD-10-CM | POA: Insufficient documentation

## 2012-01-01 DIAGNOSIS — M542 Cervicalgia: Secondary | ICD-10-CM | POA: Diagnosis not present

## 2012-01-03 DIAGNOSIS — M542 Cervicalgia: Secondary | ICD-10-CM | POA: Diagnosis not present

## 2012-01-08 DIAGNOSIS — M542 Cervicalgia: Secondary | ICD-10-CM | POA: Diagnosis not present

## 2012-01-11 DIAGNOSIS — M542 Cervicalgia: Secondary | ICD-10-CM | POA: Diagnosis not present

## 2012-01-17 DIAGNOSIS — M542 Cervicalgia: Secondary | ICD-10-CM | POA: Diagnosis not present

## 2012-01-17 DIAGNOSIS — G43009 Migraine without aura, not intractable, without status migrainosus: Secondary | ICD-10-CM | POA: Diagnosis not present

## 2012-04-11 DIAGNOSIS — Z780 Asymptomatic menopausal state: Secondary | ICD-10-CM | POA: Diagnosis not present

## 2012-04-11 DIAGNOSIS — M949 Disorder of cartilage, unspecified: Secondary | ICD-10-CM | POA: Diagnosis not present

## 2012-04-11 DIAGNOSIS — Z1231 Encounter for screening mammogram for malignant neoplasm of breast: Secondary | ICD-10-CM | POA: Diagnosis not present

## 2012-04-11 DIAGNOSIS — Z13820 Encounter for screening for osteoporosis: Secondary | ICD-10-CM | POA: Diagnosis not present

## 2012-04-11 DIAGNOSIS — M899 Disorder of bone, unspecified: Secondary | ICD-10-CM | POA: Diagnosis not present

## 2012-04-14 ENCOUNTER — Telehealth: Payer: Self-pay | Admitting: Family Medicine

## 2012-04-14 NOTE — Telephone Encounter (Signed)
Call patient and let her know that her bone density test still shows osteopenia which is mildly thin bones. Though there is some slight decrease from her last bone density test. She is to make sure she's taking calcium with vitamin D daily as well as regular exercise. Patient also recheck her vitamin D since her last one was in 2011 just to make sure that she has adequate stores to help her bones use the calcium that she is taking. Make sure she is taking calcium 1200 mg total daily. Then vitamin D 800 international units daily.

## 2012-04-15 NOTE — Telephone Encounter (Signed)
Pt notified and pt asked about other blood work that she is due for. Advised pt that it looks like she may be due for chol check also but I didn't see a CPE within the last year so she may want to check and then schedule a wellness exam to get all blood work done at that time

## 2012-04-16 DIAGNOSIS — R928 Other abnormal and inconclusive findings on diagnostic imaging of breast: Secondary | ICD-10-CM | POA: Diagnosis not present

## 2012-04-23 ENCOUNTER — Encounter: Payer: Self-pay | Admitting: Family Medicine

## 2012-04-25 DIAGNOSIS — N641 Fat necrosis of breast: Secondary | ICD-10-CM | POA: Diagnosis not present

## 2012-04-25 DIAGNOSIS — R928 Other abnormal and inconclusive findings on diagnostic imaging of breast: Secondary | ICD-10-CM | POA: Diagnosis not present

## 2012-05-06 ENCOUNTER — Encounter: Payer: Self-pay | Admitting: Family Medicine

## 2012-06-04 ENCOUNTER — Encounter: Payer: Self-pay | Admitting: Family Medicine

## 2012-06-09 DIAGNOSIS — Z23 Encounter for immunization: Secondary | ICD-10-CM | POA: Diagnosis not present

## 2012-08-12 ENCOUNTER — Encounter: Payer: Self-pay | Admitting: Family Medicine

## 2012-08-12 ENCOUNTER — Telehealth: Payer: Self-pay | Admitting: Family Medicine

## 2012-08-12 ENCOUNTER — Ambulatory Visit (INDEPENDENT_AMBULATORY_CARE_PROVIDER_SITE_OTHER): Payer: Medicare Other | Admitting: Family Medicine

## 2012-08-12 VITALS — BP 117/74 | HR 77 | Ht 63.0 in | Wt 139.0 lb

## 2012-08-12 DIAGNOSIS — D849 Immunodeficiency, unspecified: Secondary | ICD-10-CM | POA: Diagnosis not present

## 2012-08-12 DIAGNOSIS — Z23 Encounter for immunization: Secondary | ICD-10-CM

## 2012-08-12 DIAGNOSIS — K3189 Other diseases of stomach and duodenum: Secondary | ICD-10-CM | POA: Diagnosis not present

## 2012-08-12 DIAGNOSIS — Z9081 Acquired absence of spleen: Secondary | ICD-10-CM

## 2012-08-12 DIAGNOSIS — K269 Duodenal ulcer, unspecified as acute or chronic, without hemorrhage or perforation: Secondary | ICD-10-CM

## 2012-08-12 DIAGNOSIS — E7800 Pure hypercholesterolemia, unspecified: Secondary | ICD-10-CM | POA: Diagnosis not present

## 2012-08-12 DIAGNOSIS — G43909 Migraine, unspecified, not intractable, without status migrainosus: Secondary | ICD-10-CM

## 2012-08-12 DIAGNOSIS — L905 Scar conditions and fibrosis of skin: Secondary | ICD-10-CM

## 2012-08-12 DIAGNOSIS — Z9089 Acquired absence of other organs: Secondary | ICD-10-CM | POA: Diagnosis not present

## 2012-08-12 DIAGNOSIS — R1013 Epigastric pain: Secondary | ICD-10-CM | POA: Diagnosis not present

## 2012-08-12 DIAGNOSIS — E785 Hyperlipidemia, unspecified: Secondary | ICD-10-CM

## 2012-08-12 MED ORDER — TOPAMAX 50 MG PO TABS: 50.0000 mg | ORAL_TABLET | Freq: Two times a day (BID) | ORAL | Status: DC

## 2012-08-12 MED ORDER — ATORVASTATIN CALCIUM 40 MG PO TABS: 40.0000 mg | ORAL_TABLET | Freq: Every day | ORAL | Status: DC

## 2012-08-12 MED ORDER — DEXLANSOPRAZOLE 60 MG PO CPDR: 60.0000 mg | DELAYED_RELEASE_CAPSULE | Freq: Every day | ORAL | Status: DC

## 2012-08-12 NOTE — Telephone Encounter (Signed)
Last colonoscopy was 10/2003 next is due in 2015

## 2012-08-12 NOTE — Progress Notes (Signed)
Subjective:    Patient ID: Virginia Williams, female    DOB: 06/24/1946, 66 y.o.   MRN: [id number]  HPI  Hyperlipidemia -  Taking her statin w/out difficulty.  Still has occ palpiations. She has seen cardiologist for this and had a full workup. 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  She has questions about her pneumonia vaccine. They make her spleen during surgery at one point her life and they had to perform a splenectomy. She says her previous doctor told her that she needs to get several vaccines on a regular basis and she wasn't sure what they were.  She has concerns about how her incision healed on her lower abdomen from her hernia repair. She says it bulges out and she's not happy with this. She wants to know why this could have happened. She says she can't remember the surgeon's name.   Review of Systems     BP 117/74  Pulse 77  Ht 5\' 3"  (1.6 m)  Wt 139 lb (63.05 kg)  BMI 24.62 kg/m2    No Known Allergies  Past Medical History  Diagnosis Date  . C. difficile colitis     post op from cipro  . Meningioma   . Staph infection     post op  . Normal stress echocardiogram 09/2005    [facility]  . Hyperlipidemia     Past Surgical History  Procedure Date  . Breast surgery     breast implants- silicone    History   Social History  . Marital Status: Married    Spouse Name: N/A    Number of Children: N/A  . Years of Education: N/A   Occupational History  . Not on file.   Social History Main Topics  . Smoking status: Never Smoker   . Smokeless tobacco: Not on file  . Alcohol Use: No  . Drug Use: No  . Sexually Active: Not on file   Other Topics Concern  . Not on file   Social History Narrative  . No narrative on file    Family History  Problem Relation Age of Onset  . Hypertension Mother     Outpatient Encounter Prescriptions as of 08/12/2012  Medication Sig Dispense Refill  . BIOTIN PO Take by mouth.        . calcium carbonate (OS-CAL) 600 MG TABS  Take 600 mg by mouth daily.        Marland Kitchen dexlansoprazole (DEXILANT) 60 MG capsule Take 1 capsule (60 mg total) by mouth daily.  30 capsule  3  . LIPITOR 40 MG tablet TAKE 1 TABLET AT BEDTIME  90 tablet  0  . TOPAMAX 50 MG tablet TAKE 1 TABLET TWICE A DAY  180 tablet  1       Objective:   Physical Exam  Constitutional: She is oriented to person, place, and time. She appears well-developed and well-nourished.  HENT:  Head: Normocephalic and atraumatic.  Neck: Normal range of motion. Neck supple. No thyromegaly present.  Cardiovascular: Normal rate, regular rhythm and normal heart sounds.        No carotid or abdominal bruits.  Pulmonary/Chest: Effort normal and breath sounds normal.  Musculoskeletal: She exhibits no edema.  Lymphadenopathy:    She has no cervical adenopathy.  Neurological: She is alert and oriented to person, place, and time.  Skin: Skin is warm and dry.       Incision on lower abdomen protrudes out in a triangle shape. No active  drainage or erythema.  Psychiatric: She has a normal mood and affect. Her behavior is normal.          Assessment & Plan:  Hyperlipidemia-she's fasting today. We will recheck her lab levels. Refill sent to pharmacy.  Migraines doing well she does need a refill on her Topamax today.  Abnormal scar formation-she does have an abnormal scar formation along the lower abdomen from her hernia repair. The only way to correct this would be for her to see a Engineer, petroleum. I discussed this with her. Otherwise has no other treatments. Losing weight may or may not help the deformity.  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 It as far as unable to tell she has never had hepatitis A or hepatitis B vaccination. We'll start first Twinrix injection today.  History of duodenal ulcer-she would like a refill on her Dexilant. She just uses it mostly as needed.

## 2012-08-12 NOTE — Telephone Encounter (Signed)
Call GI, Dr. Noelle Penner and find out when last colonoscopy and when due for next one.

## 2012-08-13 ENCOUNTER — Telehealth: Payer: Self-pay | Admitting: *Deleted

## 2012-08-13 LAB — LIPID PANEL
Cholesterol: 194 mg/dL (ref 0–200)
Total CHOL/HDL Ratio: 3.3 Ratio
Triglycerides: 132 mg/dL (ref <150)
VLDL: 26 mg/dL (ref 0–40)

## 2012-08-13 LAB — COMPLETE METABOLIC PANEL WITH GFR
AST: 24 U/L (ref 0–37)
Alkaline Phosphatase: 85 U/L (ref 39–117)
BUN: 15 mg/dL (ref 6–23)
Calcium: 10 mg/dL (ref 8.4–10.5)
Creat: 0.93 mg/dL (ref 0.50–1.10)
GFR, Est Non African American: 64 mL/min
Glucose, Bld: 101 mg/dL — ABNORMAL HIGH (ref 70–99)

## 2012-08-13 NOTE — Telephone Encounter (Signed)
SEE OTHER NOTE

## 2012-09-09 ENCOUNTER — Ambulatory Visit: Payer: Medicare Other

## 2012-09-09 DIAGNOSIS — D235 Other benign neoplasm of skin of trunk: Secondary | ICD-10-CM | POA: Diagnosis not present

## 2012-09-09 DIAGNOSIS — D234 Other benign neoplasm of skin of scalp and neck: Secondary | ICD-10-CM | POA: Diagnosis not present

## 2012-09-09 DIAGNOSIS — D485 Neoplasm of uncertain behavior of skin: Secondary | ICD-10-CM | POA: Diagnosis not present

## 2012-09-12 ENCOUNTER — Ambulatory Visit (INDEPENDENT_AMBULATORY_CARE_PROVIDER_SITE_OTHER): Payer: Medicare Other | Admitting: Family Medicine

## 2012-09-12 DIAGNOSIS — Z298 Encounter for other specified prophylactic measures: Secondary | ICD-10-CM

## 2012-09-12 DIAGNOSIS — Z23 Encounter for immunization: Secondary | ICD-10-CM

## 2012-09-12 NOTE — Progress Notes (Signed)
  Subjective:    Patient ID: Virginia Williams, female    DOB: [birth year], 67 y.o.   MRN: [id number]  HPI  Here for 2nd twinrix  Review of Systems     Objective:   Physical Exam        Assessment & Plan:  Pt will schedule nurse visit for 3rd injection six months from 12/3. Pt aware that appt needs to be 6 months or later.

## 2012-10-06 DIAGNOSIS — R918 Other nonspecific abnormal finding of lung field: Secondary | ICD-10-CM | POA: Diagnosis not present

## 2012-10-06 DIAGNOSIS — R079 Chest pain, unspecified: Secondary | ICD-10-CM | POA: Diagnosis not present

## 2012-10-06 DIAGNOSIS — I10 Essential (primary) hypertension: Secondary | ICD-10-CM | POA: Diagnosis not present

## 2012-10-06 DIAGNOSIS — R0789 Other chest pain: Secondary | ICD-10-CM | POA: Diagnosis not present

## 2012-10-06 DIAGNOSIS — Z79899 Other long term (current) drug therapy: Secondary | ICD-10-CM | POA: Diagnosis not present

## 2012-10-07 DIAGNOSIS — E785 Hyperlipidemia, unspecified: Secondary | ICD-10-CM | POA: Diagnosis not present

## 2012-10-07 DIAGNOSIS — R002 Palpitations: Secondary | ICD-10-CM | POA: Diagnosis not present

## 2012-10-07 DIAGNOSIS — I491 Atrial premature depolarization: Secondary | ICD-10-CM | POA: Diagnosis not present

## 2012-10-07 DIAGNOSIS — R079 Chest pain, unspecified: Secondary | ICD-10-CM | POA: Diagnosis not present

## 2012-10-15 DIAGNOSIS — R002 Palpitations: Secondary | ICD-10-CM | POA: Diagnosis not present

## 2012-10-15 DIAGNOSIS — R079 Chest pain, unspecified: Secondary | ICD-10-CM | POA: Diagnosis not present

## 2012-10-15 DIAGNOSIS — I491 Atrial premature depolarization: Secondary | ICD-10-CM | POA: Diagnosis not present

## 2012-10-20 ENCOUNTER — Encounter: Payer: Self-pay | Admitting: Family Medicine

## 2012-11-10 DIAGNOSIS — I498 Other specified cardiac arrhythmias: Secondary | ICD-10-CM | POA: Diagnosis not present

## 2012-11-10 DIAGNOSIS — R002 Palpitations: Secondary | ICD-10-CM | POA: Diagnosis not present

## 2012-11-10 DIAGNOSIS — R55 Syncope and collapse: Secondary | ICD-10-CM | POA: Diagnosis not present

## 2012-11-10 DIAGNOSIS — E785 Hyperlipidemia, unspecified: Secondary | ICD-10-CM | POA: Diagnosis not present

## 2012-11-10 DIAGNOSIS — R000 Tachycardia, unspecified: Secondary | ICD-10-CM | POA: Diagnosis not present

## 2012-12-08 ENCOUNTER — Telehealth: Payer: Self-pay | Admitting: Family Medicine

## 2012-12-08 NOTE — Telephone Encounter (Signed)
Call pt: Please call her. She is due for her 6 mo repeat diagnostic mammogram. WE got a letter from [facility] as they have been trying to contact her

## 2012-12-09 NOTE — Telephone Encounter (Signed)
Attempted to call pt again line was still busy.Virginia Williams

## 2012-12-11 NOTE — Telephone Encounter (Signed)
Called pt again and her home # is busy and the alt # gives message that the phone is d/c'd. I will send pt a letter informing her that she is due for her f/u mammogram with [facility].Virginia Williams

## 2012-12-23 DIAGNOSIS — H2510 Age-related nuclear cataract, unspecified eye: Secondary | ICD-10-CM | POA: Diagnosis not present

## 2012-12-24 DIAGNOSIS — R000 Tachycardia, unspecified: Secondary | ICD-10-CM | POA: Diagnosis not present

## 2012-12-24 DIAGNOSIS — Z9189 Other specified personal risk factors, not elsewhere classified: Secondary | ICD-10-CM | POA: Diagnosis not present

## 2012-12-24 DIAGNOSIS — R922 Inconclusive mammogram: Secondary | ICD-10-CM | POA: Diagnosis not present

## 2012-12-24 DIAGNOSIS — Z01419 Encounter for gynecological examination (general) (routine) without abnormal findings: Secondary | ICD-10-CM | POA: Diagnosis not present

## 2012-12-24 DIAGNOSIS — R002 Palpitations: Secondary | ICD-10-CM | POA: Diagnosis not present

## 2012-12-24 DIAGNOSIS — Z124 Encounter for screening for malignant neoplasm of cervix: Secondary | ICD-10-CM | POA: Diagnosis not present

## 2012-12-24 DIAGNOSIS — I498 Other specified cardiac arrhythmias: Secondary | ICD-10-CM | POA: Diagnosis not present

## 2012-12-24 DIAGNOSIS — E785 Hyperlipidemia, unspecified: Secondary | ICD-10-CM | POA: Diagnosis not present

## 2012-12-24 DIAGNOSIS — R928 Other abnormal and inconclusive findings on diagnostic imaging of breast: Secondary | ICD-10-CM | POA: Diagnosis not present

## 2012-12-31 DIAGNOSIS — L905 Scar conditions and fibrosis of skin: Secondary | ICD-10-CM | POA: Diagnosis not present

## 2012-12-31 DIAGNOSIS — Z9889 Other specified postprocedural states: Secondary | ICD-10-CM | POA: Diagnosis not present

## 2013-02-04 ENCOUNTER — Other Ambulatory Visit: Payer: Self-pay | Admitting: Family Medicine

## 2013-02-10 ENCOUNTER — Other Ambulatory Visit: Payer: Self-pay | Admitting: Family Medicine

## 2013-02-10 ENCOUNTER — Ambulatory Visit (INDEPENDENT_AMBULATORY_CARE_PROVIDER_SITE_OTHER): Payer: Medicare Other | Admitting: Family Medicine

## 2013-02-10 ENCOUNTER — Encounter: Payer: Self-pay | Admitting: Family Medicine

## 2013-02-10 VITALS — BP 119/73 | HR 71 | Ht 63.0 in | Wt 142.0 lb

## 2013-02-10 DIAGNOSIS — I471 Supraventricular tachycardia: Secondary | ICD-10-CM

## 2013-02-10 DIAGNOSIS — E785 Hyperlipidemia, unspecified: Secondary | ICD-10-CM

## 2013-02-10 DIAGNOSIS — R7309 Other abnormal glucose: Secondary | ICD-10-CM

## 2013-02-10 DIAGNOSIS — I10 Essential (primary) hypertension: Secondary | ICD-10-CM

## 2013-02-10 LAB — HEMOGLOBIN A1C
Hgb A1c MFr Bld: 5.9 % — ABNORMAL HIGH (ref <5.7)
Mean Plasma Glucose: 123 mg/dL — ABNORMAL HIGH (ref <117)

## 2013-02-10 LAB — COMPLETE METABOLIC PANEL WITH GFR
AST: 19 U/L (ref 0–37)
Albumin: 4.2 g/dL (ref 3.5–5.2)
Alkaline Phosphatase: 100 U/L (ref 39–117)
BUN: 19 mg/dL (ref 6–23)
Creat: 0.85 mg/dL (ref 0.50–1.10)
GFR, Est Non African American: 71 mL/min
Glucose, Bld: 79 mg/dL (ref 70–99)
Potassium: 4.3 mEq/L (ref 3.5–5.3)

## 2013-02-10 NOTE — Progress Notes (Signed)
  Subjective:    Patient ID: Virginia Williams, female    DOB: October 12, 1945, 68 y.o.   MRN: [id number]  HPI HTN -  Pt denies chest pain, SOB, dizziness.  Taking meds as directed w/o problems.  Denies medication side effects. Still having some palpitations.    SVT - Says thinkign gabout doing ablation.  She is now on metoprolol and has helped so far but still having episodes.   Hyperlipidemia - Due to recheck . tolreating statin well. Doing well ith diet.  Lab Results  Component Value Date   CHOL 194 08/12/2012   HDL 59 08/12/2012   LDLCALC 109* 08/12/2012   LDLDIRECT 129* 06/14/2010   TRIG 132 08/12/2012   CHOLHDL 3.3 08/12/2012     Review of Systems     Objective:   Physical Exam  Constitutional: She is oriented to person, place, and time. She appears well-developed and well-nourished.  HENT:  Head: Normocephalic and atraumatic.  Cardiovascular: Normal rate, regular rhythm and normal heart sounds.   Pulmonary/Chest: Effort normal and breath sounds normal.  Neurological: She is alert and oriented to person, place, and time.  Skin: Skin is warm and dry.  Psychiatric: She has a normal mood and affect. Her behavior is normal.          Assessment & Plan:  Hypertension-well-controlled. Continue current regimen. Followup in 6 months. Due for BMP today.  SVT-she is definitely considering ablation. Since she is still having some breakthrough symptoms on metoprolol certainly think this is reasonable and his her pre-excess rate with having this procedure done.  Hyperlipidemia-tolerating statin well without any side effects or problems. She would like to have her lipids redone today but I reassured her that they were done in December and that at this point we can just check her liver and kidney function and hold off on rechecking her actual lipid numbers until December of next year. Followup in 6 months.  Elevated glucose-reviewed her last in December her glucose was borderline. We will add  a hemoglobin A1c to her blood work today.

## 2013-02-11 ENCOUNTER — Ambulatory Visit (INDEPENDENT_AMBULATORY_CARE_PROVIDER_SITE_OTHER): Payer: Medicare Other | Admitting: Family Medicine

## 2013-02-11 VITALS — Temp 98.3°F

## 2013-02-11 DIAGNOSIS — Z23 Encounter for immunization: Secondary | ICD-10-CM

## 2013-02-12 LAB — LIPID PANEL
Cholesterol: 169 mg/dL (ref 0–200)
LDL Cholesterol: 87 mg/dL (ref 0–99)
VLDL: 23 mg/dL (ref 0–40)

## 2013-02-12 NOTE — Progress Notes (Signed)
Quick Note:  All labs are normal. ______ 

## 2013-02-13 ENCOUNTER — Ambulatory Visit: Payer: Medicare Other

## 2013-05-21 DIAGNOSIS — R002 Palpitations: Secondary | ICD-10-CM | POA: Diagnosis not present

## 2013-05-21 DIAGNOSIS — I498 Other specified cardiac arrhythmias: Secondary | ICD-10-CM | POA: Diagnosis not present

## 2013-05-21 DIAGNOSIS — R55 Syncope and collapse: Secondary | ICD-10-CM | POA: Diagnosis not present

## 2013-06-12 DIAGNOSIS — Z23 Encounter for immunization: Secondary | ICD-10-CM | POA: Diagnosis not present

## 2013-07-03 DIAGNOSIS — Z1231 Encounter for screening mammogram for malignant neoplasm of breast: Secondary | ICD-10-CM | POA: Diagnosis not present

## 2013-07-20 DIAGNOSIS — R55 Syncope and collapse: Secondary | ICD-10-CM | POA: Diagnosis not present

## 2013-07-20 DIAGNOSIS — E785 Hyperlipidemia, unspecified: Secondary | ICD-10-CM | POA: Diagnosis not present

## 2013-07-20 DIAGNOSIS — I471 Supraventricular tachycardia: Secondary | ICD-10-CM | POA: Diagnosis not present

## 2013-07-20 DIAGNOSIS — Z7982 Long term (current) use of aspirin: Secondary | ICD-10-CM | POA: Diagnosis not present

## 2013-07-21 DIAGNOSIS — R000 Tachycardia, unspecified: Secondary | ICD-10-CM | POA: Diagnosis not present

## 2013-07-21 DIAGNOSIS — Z7982 Long term (current) use of aspirin: Secondary | ICD-10-CM | POA: Diagnosis not present

## 2013-07-21 DIAGNOSIS — I471 Supraventricular tachycardia, unspecified: Secondary | ICD-10-CM | POA: Insufficient documentation

## 2013-07-21 DIAGNOSIS — R55 Syncope and collapse: Secondary | ICD-10-CM | POA: Diagnosis not present

## 2013-07-21 DIAGNOSIS — E785 Hyperlipidemia, unspecified: Secondary | ICD-10-CM | POA: Diagnosis not present

## 2013-07-21 DIAGNOSIS — I498 Other specified cardiac arrhythmias: Secondary | ICD-10-CM | POA: Diagnosis not present

## 2013-08-06 ENCOUNTER — Other Ambulatory Visit: Payer: Self-pay | Admitting: Family Medicine

## 2013-08-12 ENCOUNTER — Encounter: Payer: Self-pay | Admitting: Family Medicine

## 2013-08-12 ENCOUNTER — Ambulatory Visit (INDEPENDENT_AMBULATORY_CARE_PROVIDER_SITE_OTHER): Payer: Medicare Other | Admitting: Family Medicine

## 2013-08-12 VITALS — BP 112/72 | HR 69 | Temp 97.3°F | Ht 63.0 in | Wt 142.0 lb

## 2013-08-12 DIAGNOSIS — G43909 Migraine, unspecified, not intractable, without status migrainosus: Secondary | ICD-10-CM | POA: Diagnosis not present

## 2013-08-12 DIAGNOSIS — S139XXA Sprain of joints and ligaments of unspecified parts of neck, initial encounter: Secondary | ICD-10-CM | POA: Diagnosis not present

## 2013-08-12 DIAGNOSIS — S161XXA Strain of muscle, fascia and tendon at neck level, initial encounter: Secondary | ICD-10-CM

## 2013-08-12 DIAGNOSIS — E7800 Pure hypercholesterolemia, unspecified: Secondary | ICD-10-CM

## 2013-08-12 DIAGNOSIS — R7301 Impaired fasting glucose: Secondary | ICD-10-CM | POA: Insufficient documentation

## 2013-08-12 MED ORDER — TOPAMAX 50 MG PO TABS: 50.0000 mg | ORAL_TABLET | Freq: Two times a day (BID) | ORAL | Status: DC

## 2013-08-12 NOTE — Addendum Note (Signed)
Addended by: Nani Gasser D on: 08/12/2013 09:12 AM   Modules accepted: Level of Service

## 2013-08-12 NOTE — Progress Notes (Addendum)
   Subjective:    Patient ID: Virginia Williams, female    DOB: 1946-05-03, 67 y.o.   MRN: [id number]  HPI Migraine d/o- She likes the brand Topamax better.  Works much better.  Says doing well overall. Getting some pain on the elft side of her neck more in the evening.  Started about 1-2 weeks ago. No trauma or injury. She has not tried any treatments for it. Getting les than 1 a month.  She does complain of some sleep problems as well.  Hyperlipidemia - Tolerating statin well. Wants to know if she will be able to come off of it.   Lab Results  Component Value Date   CHOL 169 02/10/2013   CHOL 169 02/10/2013   HDL 59 02/10/2013   LDLCALC 87 02/10/2013   LDLDIRECT 129* 06/14/2010   TRIG 113 02/10/2013   CHOLHDL 2.9 02/10/2013     IFG - no sxs.  Has been trying to eat right.   Lab Results  Component Value Date   HGBA1C 5.9* 02/10/2013   She has been seeing cardiology for tachycardia. He evidently did do a catheterization to see if she had an apparent tract ablation. Evidently they did not find it or did not do an ablation. She is to schedule a followup to discuss the results but has not been able to get an appointment because they have been so booked.   Review of Systems     Objective:   Physical Exam  Constitutional: She is oriented to person, place, and time. She appears well-developed and well-nourished.  HENT:  Head: Normocephalic and atraumatic.  Cardiovascular: Normal rate, regular rhythm and normal heart sounds.   Pulmonary/Chest: Effort normal and breath sounds normal.  Musculoskeletal:  Neck with normal flexion, extension, rotation right and left, side bending. She is mildly tender over the percutaneous and paracervical muscles on the right.  Neurological: She is alert and oriented to person, place, and time.  Skin: Skin is warm and dry.  Psychiatric: She has a normal mood and affect. Her behavior is normal.          Assessment & Plan:  Migraine d/o-Well controlled on  toptmax.  Prefers brand only as gets better response.  Less than 1 migraine per month.   Hyperlipidemia - well controlled. Explained that with a healthy diet and regular exercise for lower cholesterol. I suspect based on her levels when she is not taking her medication she will likely need to be on some type of cholesterol medication the rest of her life to reduce her risk of heart attack and stroke. She also has the ectatic thoracic aorta and recommend she continue to be on a statin daily aspirin. Repeat labs in 6 mo.   IFG - A1C 5.5. much improved since then.  Great job.  Recheck in 6 months to make sure staying low. If stays down then consider rechecking yearly.   Cervical strain-recommend exercises at home and using a heating pad to 3 times a day if able to for 10-15 minutes. If with stretches and heat and anti-inflammatory she's not improving over the next 2 weeks then please let me know.

## 2013-09-09 DIAGNOSIS — Z9889 Other specified postprocedural states: Secondary | ICD-10-CM | POA: Diagnosis not present

## 2013-09-09 DIAGNOSIS — I498 Other specified cardiac arrhythmias: Secondary | ICD-10-CM | POA: Diagnosis not present

## 2013-09-11 ENCOUNTER — Encounter: Payer: Self-pay | Admitting: Family Medicine

## 2013-09-14 ENCOUNTER — Other Ambulatory Visit: Payer: Self-pay

## 2013-09-14 DIAGNOSIS — E7800 Pure hypercholesterolemia, unspecified: Secondary | ICD-10-CM

## 2013-09-14 MED ORDER — TOPIRAMATE 50 MG PO TABS: 50.0000 mg | ORAL_TABLET | Freq: Two times a day (BID) | ORAL | Status: DC

## 2013-09-24 ENCOUNTER — Telehealth: Payer: Self-pay | Admitting: *Deleted

## 2013-09-24 NOTE — Telephone Encounter (Signed)
Called pt's pharmacy to find out if they ran her rx as generic which they did it was $19.10 pt did pick this up.Virginia Williams [place]

## 2013-11-07 ENCOUNTER — Other Ambulatory Visit: Payer: Self-pay | Admitting: Family Medicine

## 2013-11-09 ENCOUNTER — Telehealth: Payer: Self-pay | Admitting: *Deleted

## 2013-11-09 NOTE — Telephone Encounter (Signed)
LMOM with pt that they will only receive 2 weeks of requested medication due to needing appt with provider.  Misty Ahmad, LPN   

## 2013-11-09 NOTE — Telephone Encounter (Signed)
Previous encounter was made in error.  Oscar La, LPN

## 2013-11-17 ENCOUNTER — Telehealth: Payer: Self-pay | Admitting: *Deleted

## 2013-11-17 NOTE — Telephone Encounter (Signed)
Patient came by office yesterday to let you know that her Medicare will not pay for the Twinrix injections she got.  She got a bill for 145.00.  She called Medicare and they told her they will cover the Hep B but not the Hep A unless given what was specifically in these meds and why given together.  Pt states had to have spleenectomy done years ago and was told needed to get these injections per [facility].  Doesn't understand why this is and wanted to know if you could see if you could help

## 2013-11-17 NOTE — Telephone Encounter (Signed)
error 

## 2013-11-17 NOTE — Telephone Encounter (Signed)
Can we resubmit this under splenectomy or immunosuppression?

## 2013-12-04 NOTE — Telephone Encounter (Signed)
Spoke with Rodman Key and he resubmitted claim with other diagnosis on 11/18/2013 and awaiting response from insurance whether claim will be paid with that diagnosis or not.  Informed pt of this as well. Clemetine Marker, LPN

## 2013-12-17 DIAGNOSIS — K648 Other hemorrhoids: Secondary | ICD-10-CM | POA: Diagnosis not present

## 2013-12-17 DIAGNOSIS — Z1211 Encounter for screening for malignant neoplasm of colon: Secondary | ICD-10-CM | POA: Diagnosis not present

## 2014-01-06 ENCOUNTER — Encounter: Payer: Self-pay | Admitting: Family Medicine

## 2014-01-07 DIAGNOSIS — Z01419 Encounter for gynecological examination (general) (routine) without abnormal findings: Secondary | ICD-10-CM | POA: Diagnosis not present

## 2014-01-07 DIAGNOSIS — Z124 Encounter for screening for malignant neoplasm of cervix: Secondary | ICD-10-CM | POA: Diagnosis not present

## 2014-01-25 ENCOUNTER — Ambulatory Visit (INDEPENDENT_AMBULATORY_CARE_PROVIDER_SITE_OTHER): Payer: Medicare Other | Admitting: Family Medicine

## 2014-01-25 ENCOUNTER — Encounter: Payer: Self-pay | Admitting: Family Medicine

## 2014-01-25 VITALS — BP 120/79 | HR 79 | Wt 144.0 lb

## 2014-01-25 DIAGNOSIS — J019 Acute sinusitis, unspecified: Secondary | ICD-10-CM | POA: Diagnosis not present

## 2014-01-25 DIAGNOSIS — J312 Chronic pharyngitis: Secondary | ICD-10-CM | POA: Diagnosis not present

## 2014-01-25 MED ORDER — AMOXICILLIN-POT CLAVULANATE 875-125 MG PO TABS
1.0000 | ORAL_TABLET | Freq: Two times a day (BID) | ORAL | Status: DC
Start: 2014-01-25 — End: 2014-05-21

## 2014-01-25 MED ORDER — HYDROCODONE-HOMATROPINE 5-1.5 MG/5ML PO SYRP: 5.0000 mL | ORAL_SOLUTION | Freq: Every evening | ORAL | Status: DC | PRN

## 2014-01-25 NOTE — Progress Notes (Signed)
   Subjective:    Patient ID: Virginia Williams, female    DOB: 01/16/1946, 68 y.o.   MRN: [id number]  HPI 1 week of sinus congestion, phlegm and ST.  Say can't take cold meds bc of her heart.  Feels like not getting any better. No fever +chills or sweats. Can't stop the cough. No HA or facial pain. Cough is intermittantly dry and wet.  No Gi sxs.  Felt worse last night. She has also had a persistently sore throat. She says over the last year and a half she keeps getting recurrent sore throat. She'll wake up with it and it'll bother her for several days a time. She denies any heartburn or reflux. She think she may have some mild allergies but is not sure. She does not normally cough frequently except for this acute illness. She does occasionally sleep with her mouth open.   Review of Systems     Objective:   Physical Exam  Constitutional: She is oriented to person, place, and time. She appears well-developed and well-nourished.  HENT:  Head: Normocephalic and atraumatic.  Right Ear: External ear normal.  Left Ear: External ear normal.  Nose: Nose normal.  Mouth/Throat: Oropharynx is clear and moist.  TMs and canals are clear.   Eyes: Conjunctivae and EOM are normal. Pupils are equal, round, and reactive to light.  Neck: Neck supple. No thyromegaly present.  Cardiovascular: Normal rate, regular rhythm and normal heart sounds.   Pulmonary/Chest: Effort normal and breath sounds normal. She has no wheezes.  Lymphadenopathy:    She has no cervical adenopathy.  Neurological: She is alert and oriented to person, place, and time.  Skin: Skin is warm and dry.  Psychiatric: She has a normal mood and affect.          Assessment & Plan:  Acute sinusitis - Will tx with augmentin x 10 days.  Call if not better in one week.  Could still be viral and discussed this with her. Encouraged her to wait a couple days if she felt like she did before filling the antibiotic. Also given prescription for  nighttime cough medicine.  Sore throat-we discussed causes for chronic sore throat including reflux, sitting with her mouth open et Ronney Asters. She's very concerned. Call if still having ST after this clears up and will refer to ENT hearing [facility].

## 2014-01-29 DIAGNOSIS — I471 Supraventricular tachycardia: Secondary | ICD-10-CM | POA: Diagnosis not present

## 2014-01-29 DIAGNOSIS — R002 Palpitations: Secondary | ICD-10-CM | POA: Diagnosis not present

## 2014-01-29 DIAGNOSIS — I498 Other specified cardiac arrhythmias: Secondary | ICD-10-CM | POA: Diagnosis not present

## 2014-02-10 ENCOUNTER — Ambulatory Visit: Payer: Medicare Other | Admitting: Family Medicine

## 2014-04-01 ENCOUNTER — Other Ambulatory Visit: Payer: Self-pay | Admitting: Family Medicine

## 2014-04-19 DIAGNOSIS — H2510 Age-related nuclear cataract, unspecified eye: Secondary | ICD-10-CM | POA: Diagnosis not present

## 2014-04-29 ENCOUNTER — Other Ambulatory Visit: Payer: Self-pay | Admitting: Family Medicine

## 2014-05-04 DIAGNOSIS — D2330 Other benign neoplasm of skin of unspecified part of face: Secondary | ICD-10-CM | POA: Diagnosis not present

## 2014-05-21 ENCOUNTER — Ambulatory Visit (INDEPENDENT_AMBULATORY_CARE_PROVIDER_SITE_OTHER): Payer: Medicare Other | Admitting: Family Medicine

## 2014-05-21 ENCOUNTER — Encounter: Payer: Self-pay | Admitting: Family Medicine

## 2014-05-21 VITALS — BP 110/67 | HR 68 | Wt 138.0 lb

## 2014-05-21 DIAGNOSIS — M899 Disorder of bone, unspecified: Secondary | ICD-10-CM

## 2014-05-21 DIAGNOSIS — G609 Hereditary and idiopathic neuropathy, unspecified: Secondary | ICD-10-CM

## 2014-05-21 DIAGNOSIS — R7301 Impaired fasting glucose: Secondary | ICD-10-CM | POA: Diagnosis not present

## 2014-05-21 DIAGNOSIS — E7800 Pure hypercholesterolemia, unspecified: Secondary | ICD-10-CM | POA: Diagnosis not present

## 2014-05-21 DIAGNOSIS — M949 Disorder of cartilage, unspecified: Secondary | ICD-10-CM

## 2014-05-21 DIAGNOSIS — G5793 Unspecified mononeuropathy of bilateral lower limbs: Secondary | ICD-10-CM

## 2014-05-21 DIAGNOSIS — Z23 Encounter for immunization: Secondary | ICD-10-CM | POA: Diagnosis not present

## 2014-05-21 DIAGNOSIS — G4700 Insomnia, unspecified: Secondary | ICD-10-CM | POA: Diagnosis not present

## 2014-05-21 DIAGNOSIS — M898X9 Other specified disorders of bone, unspecified site: Secondary | ICD-10-CM

## 2014-05-21 MED ORDER — TOPIRAMATE 50 MG PO TABS: 50.0000 mg | ORAL_TABLET | Freq: Two times a day (BID) | ORAL | Status: DC

## 2014-05-21 MED ORDER — ATORVASTATIN CALCIUM 40 MG PO TABS: ORAL_TABLET | ORAL | Status: DC

## 2014-05-21 MED ORDER — DEXLANSOPRAZOLE 60 MG PO CPDR: 60.0000 mg | DELAYED_RELEASE_CAPSULE | Freq: Every day | ORAL | Status: DC

## 2014-05-21 NOTE — Assessment & Plan Note (Signed)
Well-controlled and stable at 5.7 today. Followup in 6 months. Continue to work on diet and exercise.

## 2014-05-21 NOTE — Patient Instructions (Signed)
Recommend trial of Valerian root capsules or Melatonin or Benadryl.  Take about 30 minutes before bedtime.

## 2014-05-21 NOTE — Assessment & Plan Note (Signed)
Due to repeat that and liver enzymes. Continue current regimen.

## 2014-05-21 NOTE — Progress Notes (Signed)
   Subjective:    Patient ID: Virginia Williams, female    DOB: [birth year], 68 y.o.   MRN: [id number]  Hyperlipidemia    HYperlipidemia - Taking lipitor and tolerating it well no myalgias or side effects. Lab Results  Component Value Date   CHOL 169 02/10/2013   CHOL 169 02/10/2013   HDL 59 02/10/2013   LDLCALC 87 02/10/2013   LDLDIRECT 129* 06/14/2010   TRIG 113 02/10/2013   CHOLHDL 2.9 02/10/2013     IFG - she is doing well. Feels like she is eating healthy and getting some exercise.  Both feet feel numb and tingling for several months..  Had bunion surgery on her feet years ago.  No injury. No recent back pain. She feels like his entire foot is nonspecific toe Judithe Modest. No worsening or alleviating factors.  Imsomnia - Having hard time not sleeping well for months. Says can take hours to fall asleep.  She says this is been going on for several months as well. She denies any increase in stressors et Ronney Asters. She has not tried anything over-the-counter. She really doesn't want to take medication for it.   Review of Systems She also complains of feeling achy in her shoulders and her neck. She says she feels like it's in her bones. She's been waking up with that for about the last week or so. Again no injury or trauma. Fevers, chills or sweats.    Objective:   Physical Exam  Constitutional: She is oriented to person, place, and time. She appears well-developed and well-nourished.  HENT:  Head: Normocephalic and atraumatic.  Cardiovascular: Normal rate, regular rhythm and normal heart sounds.   Pulmonary/Chest: Effort normal and breath sounds normal.  Neurological: She is alert and oriented to person, place, and time.  Skin: Skin is warm and dry.  Psychiatric: She has a normal mood and affect. Her behavior is normal.          Assessment & Plan:  Peripheral neuropathy of both feet-will evaluate with additional blood work including thyroid, B12 etc. Also consider could be coming from her  back that she's not having any back pain in the symptoms are acutely bilateral.  Flu vaccine given today.

## 2014-05-21 NOTE — Assessment & Plan Note (Signed)
Discussed sleep hygiene. Will provide additional information handout. Also recommend a trial of over-the-counter valerian root capsules versus fatigue. She can also try melatonin if that is not helpful or even Benadryl. The cannula but more so in dating and cause some morning grogginess. If not improving then please let me know.

## 2014-05-22 LAB — SEDIMENTATION RATE: SED RATE: 1 mm/h (ref 0–22)

## 2014-05-22 LAB — CBC WITH DIFFERENTIAL/PLATELET
Basophils Absolute: 0.1 10*3/uL (ref 0.0–0.1)
Basophils Relative: 1 % (ref 0–1)
Eosinophils Absolute: 0.1 10*3/uL (ref 0.0–0.7)
Eosinophils Relative: 2 % (ref 0–5)
HCT: 42.9 % (ref 36.0–46.0)
Hemoglobin: 14.2 g/dL (ref 12.0–15.0)
LYMPHS PCT: 34 % (ref 12–46)
Lymphs Abs: 2.1 10*3/uL (ref 0.7–4.0)
MCH: 29.6 pg (ref 26.0–34.0)
MCHC: 33.1 g/dL (ref 30.0–36.0)
MCV: 89.4 fL (ref 78.0–100.0)
Monocytes Absolute: 0.8 10*3/uL (ref 0.1–1.0)
Monocytes Relative: 13 % — ABNORMAL HIGH (ref 3–12)
NEUTROS ABS: 3.1 10*3/uL (ref 1.7–7.7)
NEUTROS PCT: 50 % (ref 43–77)
Platelets: 424 10*3/uL — ABNORMAL HIGH (ref 150–400)
RBC: 4.8 MIL/uL (ref 3.87–5.11)
RDW: 14 % (ref 11.5–15.5)
WBC: 6.2 10*3/uL (ref 4.0–10.5)

## 2014-05-22 LAB — VITAMIN D 25 HYDROXY (VIT D DEFICIENCY, FRACTURES): Vit D, 25-Hydroxy: 58 ng/mL (ref 30–89)

## 2014-05-22 LAB — LIPID PANEL
CHOL/HDL RATIO: 2.7 ratio
Cholesterol: 155 mg/dL (ref 0–200)
HDL: 57 mg/dL (ref >39)
LDL Cholesterol: 80 mg/dL (ref 0–99)
Triglycerides: 88 mg/dL (ref <150)
VLDL: 18 mg/dL (ref 0–40)

## 2014-05-22 LAB — COMPLETE METABOLIC PANEL WITH GFR
ALK PHOS: 82 U/L (ref 39–117)
ALT: 30 U/L (ref 0–35)
AST: 26 U/L (ref 0–37)
Albumin: 4.5 g/dL (ref 3.5–5.2)
BILIRUBIN TOTAL: 0.6 mg/dL (ref 0.2–1.2)
BUN: 22 mg/dL (ref 6–23)
CO2: 20 meq/L (ref 19–32)
CREATININE: 0.8 mg/dL (ref 0.50–1.10)
Calcium: 9.2 mg/dL (ref 8.4–10.5)
Chloride: 110 mEq/L (ref 96–112)
GFR, EST AFRICAN AMERICAN: 88 mL/min
GFR, EST NON AFRICAN AMERICAN: 76 mL/min
GLUCOSE: 74 mg/dL (ref 70–99)
Potassium: 4.3 mEq/L (ref 3.5–5.3)
Sodium: 141 mEq/L (ref 135–145)
Total Protein: 6.9 g/dL (ref 6.0–8.3)

## 2014-05-22 LAB — FERRITIN: FERRITIN: 143 ng/mL (ref 10–291)

## 2014-05-22 LAB — TSH
TSH: 1.934 u[IU]/mL (ref 0.350–4.500)
TSH: 1.918 u[IU]/mL (ref 0.350–4.500)

## 2014-05-22 LAB — VITAMIN B12: Vitamin B-12: 335 pg/mL (ref 211–911)

## 2014-05-22 LAB — FOLATE: Folate: 8.4 ng/mL

## 2014-05-24 ENCOUNTER — Ambulatory Visit: Payer: Medicare Other | Admitting: Family Medicine

## 2014-05-26 LAB — VITAMIN B6: VITAMIN B6: 10.8 ng/mL (ref 2.1–21.7)

## 2014-05-31 ENCOUNTER — Other Ambulatory Visit: Payer: Self-pay | Admitting: Family Medicine

## 2014-05-31 DIAGNOSIS — G609 Hereditary and idiopathic neuropathy, unspecified: Secondary | ICD-10-CM

## 2014-06-07 ENCOUNTER — Ambulatory Visit (INDEPENDENT_AMBULATORY_CARE_PROVIDER_SITE_OTHER): Payer: Medicare Other

## 2014-06-07 DIAGNOSIS — M4120 Other idiopathic scoliosis, site unspecified: Secondary | ICD-10-CM

## 2014-06-07 DIAGNOSIS — M47817 Spondylosis without myelopathy or radiculopathy, lumbosacral region: Secondary | ICD-10-CM | POA: Diagnosis not present

## 2014-06-07 DIAGNOSIS — G609 Hereditary and idiopathic neuropathy, unspecified: Secondary | ICD-10-CM

## 2014-06-09 ENCOUNTER — Telehealth: Payer: Self-pay | Admitting: *Deleted

## 2014-06-09 NOTE — Telephone Encounter (Signed)
Pt is ok with taking prednisone.Virginia Williams

## 2014-06-10 MED ORDER — PREDNISONE 20 MG PO TABS: 40.0000 mg | ORAL_TABLET | Freq: Every day | ORAL | Status: DC

## 2014-06-10 NOTE — Telephone Encounter (Signed)
rx sent

## 2014-06-14 ENCOUNTER — Other Ambulatory Visit: Payer: Self-pay | Admitting: *Deleted

## 2014-06-14 ENCOUNTER — Telehealth: Payer: Self-pay | Admitting: *Deleted

## 2014-06-14 NOTE — Telephone Encounter (Signed)
Patient called and she was very upset that she rec'd a bill for her flu shot of $145. She said that she was not paying for this due to the fact she could have gone to [facility] and rec'd it for free. She doesn't know if it is a billing error. Should I forward this to Abigail Butts or Rodman Key? Margette Fast, CMA

## 2014-06-14 NOTE — Telephone Encounter (Signed)
This is really unusual., Please forward to both Abigail Butts and to Arnold.

## 2014-06-15 NOTE — Telephone Encounter (Signed)
Dr. Madilyn Fireman,  I reviewed patient's account and the $145 balance is from prior visit on 02/11/2013 when she received hepA/hepB vaccine.  She did received flu and pneu vaccines on 05/21/2014 but that has not been processed by the insurance still awaiting explanation of benefits. Thanks Rodman Key

## 2014-06-16 NOTE — Telephone Encounter (Signed)
Went can you look into this. Looks like we resubmitted the codes in 2013/11/21 for splenectomy. Will forward to our office manager to investigate reason for denial.

## 2014-06-16 NOTE — Telephone Encounter (Signed)
I called Virginia Williams to try to resolve the billing issue and she said that she already spoke with someone regarding the $145 bill prior to this and it is a billing issue or coding issue that was needing corrected. She is now even more upset that this was not corrected and she said that she is not paying $145 bill from June and she said that if she receives a bill for the flu vaccine she will not pay it either because she would have been able to [facility] at [facility] and they would have billed her insurance properly the first time without receiving a bill.

## 2014-06-16 NOTE — Telephone Encounter (Signed)
Please call the patient and explain the following.

## 2014-06-17 NOTE — Telephone Encounter (Signed)
I have sent an email to [facility] in the billing department to get the denial information on this claim

## 2014-06-21 ENCOUNTER — Encounter: Payer: Self-pay | Admitting: Family Medicine

## 2014-06-21 ENCOUNTER — Ambulatory Visit (INDEPENDENT_AMBULATORY_CARE_PROVIDER_SITE_OTHER): Payer: Medicare Other | Admitting: Family Medicine

## 2014-06-21 VITALS — BP 108/69 | HR 74 | Wt 140.0 lb

## 2014-06-21 DIAGNOSIS — G609 Hereditary and idiopathic neuropathy, unspecified: Secondary | ICD-10-CM

## 2014-06-21 DIAGNOSIS — G4700 Insomnia, unspecified: Secondary | ICD-10-CM | POA: Diagnosis not present

## 2014-06-21 DIAGNOSIS — Z9081 Acquired absence of spleen: Secondary | ICD-10-CM | POA: Diagnosis not present

## 2014-06-21 NOTE — Progress Notes (Signed)
   Subjective:    Patient ID: Virginia Williams, female    DOB: 22-Aug-1946, 68 y.o.   MRN: [id number]  HPI Insomnia- she is still not sleeping well. She hasn't tried the valerian root yet as suggested. She says she plans to purchase hasn't gone to buy it yet.  Numbness and tingling.  Says is overall better. Labs are better as well. She denies any major changes but feels like her symptoms have improved. It's much less bothersome. We did do blood work to rule out B12 deficiency etc. it was normal. Thyroid was normal as well.   Review of Systems     Objective:   Physical Exam  Constitutional: She is oriented to person, place, and time. She appears well-developed and well-nourished.  HENT:  Head: Normocephalic and atraumatic.  Eyes: Conjunctivae and EOM are normal.  Cardiovascular: Normal rate.   Pulmonary/Chest: Effort normal.  Neurological: She is alert and oriented to person, place, and time.  Skin: Skin is dry. No pallor.  Psychiatric: She has a normal mood and affect. Her behavior is normal.          Assessment & Plan:  Insomnia -uncontrolled the patient is not wanting to be aggressive in treatment.  Numbness/tingling in the feet-peripheral neuropathy-still unclear if this is coming from her low back or if a peripheral nerve issue. It seems to have improved on its own so at this point we will just continue to monitor. I did do pretty thorough lab work to rule out metabolic deficiencies. She is not diabetic so this is unlikely. She will call if her symptoms progress or worsen.

## 2014-09-09 DIAGNOSIS — K130 Diseases of lips: Secondary | ICD-10-CM | POA: Diagnosis not present

## 2014-09-09 DIAGNOSIS — L578 Other skin changes due to chronic exposure to nonionizing radiation: Secondary | ICD-10-CM | POA: Diagnosis not present

## 2014-09-14 DIAGNOSIS — Z1231 Encounter for screening mammogram for malignant neoplasm of breast: Secondary | ICD-10-CM | POA: Diagnosis not present

## 2014-09-15 NOTE — Telephone Encounter (Signed)
This bill has been adjusted off.  It was too old to go back and bill to Medicare.  Got confirmation from [facility] in billing that it was taken care of on 08/26/14.  I called and spoke directly with the patient to let her know.  WB

## 2014-09-27 ENCOUNTER — Encounter: Payer: Self-pay | Admitting: Family Medicine

## 2014-11-22 ENCOUNTER — Ambulatory Visit: Payer: Medicare Other | Admitting: Family Medicine

## 2015-01-21 DIAGNOSIS — K130 Diseases of lips: Secondary | ICD-10-CM | POA: Diagnosis not present

## 2015-01-28 DIAGNOSIS — K130 Diseases of lips: Secondary | ICD-10-CM | POA: Diagnosis not present

## 2015-02-09 DIAGNOSIS — Z124 Encounter for screening for malignant neoplasm of cervix: Secondary | ICD-10-CM | POA: Diagnosis not present

## 2015-02-09 DIAGNOSIS — Z01419 Encounter for gynecological examination (general) (routine) without abnormal findings: Secondary | ICD-10-CM | POA: Diagnosis not present

## 2015-03-28 DIAGNOSIS — R1011 Right upper quadrant pain: Secondary | ICD-10-CM | POA: Diagnosis not present

## 2015-04-01 DIAGNOSIS — R1011 Right upper quadrant pain: Secondary | ICD-10-CM | POA: Diagnosis not present

## 2015-04-24 ENCOUNTER — Other Ambulatory Visit: Payer: Self-pay | Admitting: Family Medicine

## 2015-04-26 ENCOUNTER — Ambulatory Visit (INDEPENDENT_AMBULATORY_CARE_PROVIDER_SITE_OTHER): Payer: Medicare Other | Admitting: Family Medicine

## 2015-04-26 ENCOUNTER — Encounter: Payer: Self-pay | Admitting: Family Medicine

## 2015-04-26 VITALS — BP 133/77 | HR 88 | Wt 138.0 lb

## 2015-04-26 DIAGNOSIS — M5431 Sciatica, right side: Secondary | ICD-10-CM

## 2015-04-26 MED ORDER — PREDNISONE 20 MG PO TABS: 40.0000 mg | ORAL_TABLET | Freq: Every day | ORAL | Status: DC

## 2015-04-26 MED ORDER — TIZANIDINE HCL 4 MG PO TABS: 4.0000 mg | ORAL_TABLET | Freq: Every evening | ORAL | Status: DC | PRN

## 2015-04-26 NOTE — Patient Instructions (Signed)
Sciatica Sciatica is pain, weakness, numbness, or tingling along the path of the sciatic nerve. The nerve starts in the lower back and runs down the back of each leg. The nerve controls the muscles in the lower leg and in the back of the knee, while also providing sensation to the back of the thigh, lower leg, and the sole of your foot. Sciatica is a symptom of another medical condition. For instance, nerve damage or certain conditions, such as a herniated disk or bone spur on the spine, pinch or put pressure on the sciatic nerve. This causes the pain, weakness, or other sensations normally associated with sciatica. Generally, sciatica only affects one side of the body. CAUSES   Herniated or slipped disc.  Degenerative disk disease.  A pain disorder involving the narrow muscle in the buttocks (piriformis syndrome).  Pelvic injury or fracture.  Pregnancy.  Tumor (rare). SYMPTOMS  Symptoms can vary from mild to very severe. The symptoms usually travel from the low back to the buttocks and down the back of the leg. Symptoms can include:  Mild tingling or dull aches in the lower back, leg, or hip.  Numbness in the back of the calf or sole of the foot.  Burning sensations in the lower back, leg, or hip.  Sharp pains in the lower back, leg, or hip.  Leg weakness.  Severe back pain inhibiting movement. These symptoms may get worse with coughing, sneezing, laughing, or prolonged sitting or standing. Also, being overweight may worsen symptoms. DIAGNOSIS  Your caregiver will perform a physical exam to look for common symptoms of sciatica. He or she may ask you to do certain movements or activities that would trigger sciatic nerve pain. Other tests may be performed to find the cause of the sciatica. These may include:  Blood tests.  X-rays.  Imaging tests, such as an MRI or CT scan. TREATMENT  Treatment is directed at the cause of the sciatic pain. Sometimes, treatment is not necessary  and the pain and discomfort goes away on its own. If treatment is needed, your caregiver may suggest:  Over-the-counter medicines to relieve pain.  Prescription medicines, such as anti-inflammatory medicine, muscle relaxants, or narcotics.  Applying heat or ice to the painful area.  Steroid injections to lessen pain, irritation, and inflammation around the nerve.  Reducing activity during periods of pain.  Exercising and stretching to strengthen your abdomen and improve flexibility of your spine. Your caregiver may suggest losing weight if the extra weight makes the back pain worse.  Physical therapy.  Surgery to eliminate what is pressing or pinching the nerve, such as a bone spur or part of a herniated disk. HOME CARE INSTRUCTIONS   Only take over-the-counter or prescription medicines for pain or discomfort as directed by your caregiver.  Apply ice to the affected area for 20 minutes, 3-4 times a day for the first 48-72 hours. Then try heat in the same way.  Exercise, stretch, or perform your usual activities if these do not aggravate your pain.  Attend physical therapy sessions as directed by your caregiver.  Keep all follow-up appointments as directed by your caregiver.  Do not wear high heels or shoes that do not provide proper support.  Check your mattress to see if it is too soft. A firm mattress may lessen your pain and discomfort. SEEK IMMEDIATE MEDICAL CARE IF:   You lose control of your bowel or bladder (incontinence).  You have increasing weakness in the lower back, pelvis, buttocks,   or legs.  You have redness or swelling of your back.  You have a burning sensation when you urinate.  You have pain that gets worse when you lie down or awakens you at night.  Your pain is worse than you have experienced in the past.  Your pain is lasting longer than 4 weeks.  You are suddenly losing weight without reason. MAKE SURE YOU:  Understand these  instructions.  Will watch your condition.  Will get help right away if you are not doing well or get worse. Document Released: 08/21/2001 Document Revised: 02/26/2012 Document Reviewed: 01/06/2012 Exit[employer] Patient Information 2015 [employer]. This information is not intended to replace advice given to you by your health care provider. Make sure you discuss any questions you have with your health care provider.  

## 2015-04-26 NOTE — Progress Notes (Signed)
   Subjective:    Patient ID: Virginia Williams, female    DOB: 09/23/1945, 69 y.o.   MRN: [id number]  HPI Patient complains of right-sided lower back pain for approximately one week. She said she just woke up with it hurting one morning and doesn't number any specific injury but she has been riding her motorcycle recently. She's been using Tylenol for pain control. She rates her pain 10 over 10. She says the pain does radiate down towards her right leg into her buttock area. It seems to be worse with movement and better at rest.   Review of Systems     Objective:   Physical Exam  Constitutional: She is oriented to person, place, and time. She appears well-developed and well-nourished.  HENT:  Head: Normocephalic and atraumatic.  Musculoskeletal:  Lumbar spine with normal flexion and extension. The she did have more pain with extension. Normal rotation right and left that she did express pain with rotation to the right. Nontender over the lumbar spine but she is tender over the right paraspinous muscles. Nontender over the SI joint. Positive straight really graze on the right. Hip, knee, ankle stent is 5 out of 5 bilaterally. Patellar reflexes 1+ bilaterally.  Neurological: She is alert and oriented to person, place, and time.  Skin: Skin is warm and dry.  Psychiatric: She has a normal mood and affect.          Assessment & Plan:  Right-sided sciatica-discussed diagnosis. Will treat with either course of prednisone. Also given a muscle relaxer to use if needed just at bedtime. Make sure take the prednisone with food and water supplementally if any GI upset or irritation. After that she can switch either Tylenol or anti-inflammatory said she does have a history of ulcers. Given handout on exercises/stretches to do on her own at home. Call if not better in the next 3 weeks.

## 2015-04-27 ENCOUNTER — Other Ambulatory Visit: Payer: Self-pay | Admitting: *Deleted

## 2015-04-27 MED ORDER — TOPIRAMATE 50 MG PO TABS: 50.0000 mg | ORAL_TABLET | Freq: Two times a day (BID) | ORAL | Status: DC

## 2015-05-09 ENCOUNTER — Ambulatory Visit (INDEPENDENT_AMBULATORY_CARE_PROVIDER_SITE_OTHER): Payer: Medicare Other | Admitting: Family Medicine

## 2015-05-09 ENCOUNTER — Encounter: Payer: Self-pay | Admitting: Family Medicine

## 2015-05-09 VITALS — BP 109/67 | HR 72 | Wt 140.9 lb

## 2015-05-09 DIAGNOSIS — M5410 Radiculopathy, site unspecified: Secondary | ICD-10-CM

## 2015-05-09 DIAGNOSIS — G5702 Lesion of sciatic nerve, left lower limb: Secondary | ICD-10-CM | POA: Diagnosis not present

## 2015-05-09 MED ORDER — TRAMADOL HCL 50 MG PO TABS: 50.0000 mg | ORAL_TABLET | Freq: Three times a day (TID) | ORAL | Status: DC | PRN

## 2015-05-09 NOTE — Progress Notes (Signed)
   Subjective:    Patient ID: Virginia Williams, female    DOB: 14-May-1946, 69 y.o.   MRN: [id number]  HPI Follow-up right-sided sciatica-she was seen 2 weeks ago and diagnosed here in the office. She was given a course of prednisone and a muscle relaxer to use at bedtime. She is coming back in today because she feels like she is still hurting and in a lot of pain. She did start to do home exercises and stertches and now she is having pain on her left side. Did go out of town and now having pain in th left buttock.   She did have a lumbar spine x-ray in September 2015 just a little less than a year ago. She does have a little bit of scoliosis with significant disc issues at L2-3, L3-4 and L4-5 she also had facet arthritis at multiple levels but particularly at L3-4, L4-5, L5-S1 bilaterally. Feels like pain is in her buttock cheek and radiating into her foot. Says the prednisone upset her stomach.    She wanted the names of some surgeons for her husbands back.   Review of Systems     Objective:   Physical Exam  Constitutional: She is oriented to person, place, and time. She appears well-developed and well-nourished.  HENT:  Head: Normocephalic and atraumatic.  Musculoskeletal:  Pain with lumbar flexion in the left buttock area. Normal extension. Normal rotation right and left. Normal side bending. Nontender over the lumbar spine. She is tender over the left piriformis. Negative straight leg raise. Hip, knee, ankle strength is 5 out of 5 bilaterally. Patellar reflexes 1+ bilaterally.  Neurological: She is alert and oriented to person, place, and time.  Skin: Skin is warm and dry.  Psychiatric: She has a normal mood and affect. Her behavior is normal.        Assessment & Plan:  Left piriformis pain w/ radiculopathy - given handout on stretches. She did not tolerate press prednisone and really should not take NSAIDs as recommended by her GI. Will treat with tramadol. If she is not better in 3  weeks then consider xray and then MRI.  I think conservative measures at this point is best. Without any recent trauma to be concerning for fracture I think this is most likely related to a herniated disc issue which is known her previous x-ray from a year ago.

## 2015-05-09 NOTE — Patient Instructions (Signed)
Dr. Elisabeth Cara, Neurosurgeon.  Dr. Saintclair Halsted, Dr. Rita Ohara, Dr. Kristeen Mans in [place]

## 2015-05-12 DIAGNOSIS — M5416 Radiculopathy, lumbar region: Secondary | ICD-10-CM | POA: Diagnosis not present

## 2015-05-12 DIAGNOSIS — M47816 Spondylosis without myelopathy or radiculopathy, lumbar region: Secondary | ICD-10-CM | POA: Diagnosis not present

## 2015-05-12 DIAGNOSIS — M549 Dorsalgia, unspecified: Secondary | ICD-10-CM | POA: Diagnosis not present

## 2015-05-19 DIAGNOSIS — M4726 Other spondylosis with radiculopathy, lumbar region: Secondary | ICD-10-CM | POA: Diagnosis not present

## 2015-05-19 DIAGNOSIS — M5126 Other intervertebral disc displacement, lumbar region: Secondary | ICD-10-CM | POA: Diagnosis not present

## 2015-05-19 DIAGNOSIS — M47816 Spondylosis without myelopathy or radiculopathy, lumbar region: Secondary | ICD-10-CM | POA: Diagnosis not present

## 2015-05-19 DIAGNOSIS — M4727 Other spondylosis with radiculopathy, lumbosacral region: Secondary | ICD-10-CM | POA: Diagnosis not present

## 2015-05-19 DIAGNOSIS — M5136 Other intervertebral disc degeneration, lumbar region: Secondary | ICD-10-CM | POA: Diagnosis not present

## 2015-05-22 ENCOUNTER — Other Ambulatory Visit: Payer: Self-pay | Admitting: Family Medicine

## 2015-05-26 DIAGNOSIS — Z23 Encounter for immunization: Secondary | ICD-10-CM | POA: Diagnosis not present

## 2015-06-02 ENCOUNTER — Other Ambulatory Visit: Payer: Self-pay | Admitting: Family Medicine

## 2015-06-02 DIAGNOSIS — M47816 Spondylosis without myelopathy or radiculopathy, lumbar region: Secondary | ICD-10-CM | POA: Diagnosis not present

## 2015-06-02 DIAGNOSIS — M549 Dorsalgia, unspecified: Secondary | ICD-10-CM | POA: Diagnosis not present

## 2015-06-09 DIAGNOSIS — M2560 Stiffness of unspecified joint, not elsewhere classified: Secondary | ICD-10-CM | POA: Diagnosis not present

## 2015-06-09 DIAGNOSIS — M549 Dorsalgia, unspecified: Secondary | ICD-10-CM | POA: Diagnosis not present

## 2015-06-09 DIAGNOSIS — M5416 Radiculopathy, lumbar region: Secondary | ICD-10-CM | POA: Diagnosis not present

## 2015-06-09 DIAGNOSIS — M6281 Muscle weakness (generalized): Secondary | ICD-10-CM | POA: Diagnosis not present

## 2015-09-01 ENCOUNTER — Other Ambulatory Visit: Payer: Self-pay | Admitting: Family Medicine

## 2015-09-15 ENCOUNTER — Ambulatory Visit: Payer: No Typology Code available for payment source | Admitting: Family Medicine

## 2015-09-16 DIAGNOSIS — N819 Female genital prolapse, unspecified: Secondary | ICD-10-CM | POA: Diagnosis not present

## 2015-09-16 DIAGNOSIS — Z1231 Encounter for screening mammogram for malignant neoplasm of breast: Secondary | ICD-10-CM | POA: Diagnosis not present

## 2015-09-16 LAB — HM MAMMOGRAPHY

## 2015-09-21 DIAGNOSIS — H2513 Age-related nuclear cataract, bilateral: Secondary | ICD-10-CM | POA: Diagnosis not present

## 2015-09-23 DIAGNOSIS — M79671 Pain in right foot: Secondary | ICD-10-CM | POA: Diagnosis not present

## 2015-09-23 DIAGNOSIS — M722 Plantar fascial fibromatosis: Secondary | ICD-10-CM | POA: Diagnosis not present

## 2015-10-02 ENCOUNTER — Other Ambulatory Visit: Payer: Self-pay | Admitting: Family Medicine

## 2015-10-06 ENCOUNTER — Encounter: Payer: Self-pay | Admitting: Family Medicine

## 2015-10-28 DIAGNOSIS — M722 Plantar fascial fibromatosis: Secondary | ICD-10-CM | POA: Diagnosis not present

## 2015-10-28 DIAGNOSIS — M79671 Pain in right foot: Secondary | ICD-10-CM | POA: Diagnosis not present

## 2015-11-14 ENCOUNTER — Ambulatory Visit (INDEPENDENT_AMBULATORY_CARE_PROVIDER_SITE_OTHER): Payer: Medicare Other | Admitting: Family Medicine

## 2015-11-14 ENCOUNTER — Encounter: Payer: Self-pay | Admitting: Family Medicine

## 2015-11-14 VITALS — BP 125/66 | HR 75 | Wt 142.0 lb

## 2015-11-14 DIAGNOSIS — G5793 Unspecified mononeuropathy of bilateral lower limbs: Secondary | ICD-10-CM

## 2015-11-14 DIAGNOSIS — B508 Other severe and complicated Plasmodium falciparum malaria: Secondary | ICD-10-CM | POA: Diagnosis not present

## 2015-11-14 DIAGNOSIS — R7301 Impaired fasting glucose: Secondary | ICD-10-CM

## 2015-11-14 DIAGNOSIS — G43009 Migraine without aura, not intractable, without status migrainosus: Secondary | ICD-10-CM | POA: Diagnosis not present

## 2015-11-14 DIAGNOSIS — Z23 Encounter for immunization: Secondary | ICD-10-CM

## 2015-11-14 DIAGNOSIS — E7800 Pure hypercholesterolemia, unspecified: Secondary | ICD-10-CM

## 2015-11-14 LAB — HEMOGLOBIN A1C
HEMOGLOBIN A1C: 5.7 % — AB (ref <5.7)
MEAN PLASMA GLUCOSE: 117 mg/dL — AB (ref <117)

## 2015-11-14 LAB — LIPID PANEL
Cholesterol: 197 mg/dL (ref 125–200)
HDL: 65 mg/dL (ref >=46)
LDL Cholesterol: 107 mg/dL (ref <130)
TRIGLYCERIDES: 123 mg/dL (ref <150)
Total CHOL/HDL Ratio: 3 Ratio (ref <=5.0)
VLDL: 25 mg/dL (ref <30)

## 2015-11-14 LAB — COMPLETE METABOLIC PANEL WITH GFR
ALT: 18 U/L (ref 6–29)
AST: 18 U/L (ref 10–35)
Albumin: 4.4 g/dL (ref 3.6–5.1)
Alkaline Phosphatase: 89 U/L (ref 33–130)
BILIRUBIN TOTAL: 0.6 mg/dL (ref 0.2–1.2)
BUN: 14 mg/dL (ref 7–25)
CO2: 21 mmol/L (ref 20–31)
CREATININE: 0.78 mg/dL (ref 0.60–0.93)
Calcium: 9.4 mg/dL (ref 8.6–10.4)
Chloride: 109 mmol/L (ref 98–110)
GFR, EST AFRICAN AMERICAN: 89 mL/min (ref >=60)
GFR, Est Non African American: 77 mL/min (ref >=60)
Glucose, Bld: 98 mg/dL (ref 65–99)
Potassium: 4.7 mmol/L (ref 3.5–5.3)
Sodium: 145 mmol/L (ref 135–146)
TOTAL PROTEIN: 6.8 g/dL (ref 6.1–8.1)

## 2015-11-14 LAB — VITAMIN B12: Vitamin B-12: 424 pg/mL (ref 200–1100)

## 2015-11-14 LAB — TSH: TSH: 1.59 m[IU]/L

## 2015-11-14 MED ORDER — ATORVASTATIN CALCIUM 40 MG PO TABS: 40.0000 mg | ORAL_TABLET | Freq: Every day | ORAL | Status: DC

## 2015-11-14 MED ORDER — TOPIRAMATE 50 MG PO TABS: 50.0000 mg | ORAL_TABLET | Freq: Two times a day (BID) | ORAL | Status: DC

## 2015-11-14 NOTE — Progress Notes (Signed)
Subjective:    Patient ID: Virginia Williams, female    DOB: 02/20/1946, 70 y.o.   MRN: [id number]  HPI Migraine Headache - has been having a few mild HA. Says overall doing ok. Feels like the brand topamax works better for her. She's currently taking 50 mg twice a day.   IFG - she really tries to watch her diet and avoid concentrated sweets. Lab Results  Component Value Date   HGBA1C 5.9* 02/10/2013   Feels like bottoms of feet at burning.  She says this is been going on for quite some time. She's not noticed any rash etc.   Hyperlipidemia  - dong well on statin. No myalgias or S.E.  she did note that her brother had difficulty with statins and he is intolerant.   Review of Systems  BP 125/66 mmHg  Pulse 75  Wt 142 lb (64.411 kg)  SpO2 99%    No Known Allergies  Past Medical History  Diagnosis Date  . C. difficile colitis     post op from cipro  . Meningioma ([place])   . Staph infection     post op  . Normal stress echocardiogram 09/2005    [facility]  . Hyperlipidemia     Past Surgical History  Procedure Laterality Date  . Breast surgery      breast implants- silicone  . Atrial ablation surgery      for AVNRT at [place] History   Social History  . Marital Status: Married    Spouse Name: N/A  . Number of Children: N/A  . Years of Education: N/A   Occupational History  . Not on file.   Social History Main Topics  . Smoking status: Never Smoker   . Smokeless tobacco: Not on file  . Alcohol Use: No  . Drug Use: No  . Sexual Activity: Not on file   Other Topics Concern  . Not on file   Social History Narrative    Family History  Problem Relation Age of Onset  . Hypertension Mother     Outpatient Encounter Prescriptions as of 11/14/2015  Medication Sig  . atorvastatin (LIPITOR) 40 MG tablet Take 1 tablet (40 mg total) by mouth daily at 6 PM.  . Biotin 5000 MCG CAPS Take by mouth.  . Cholecalciferol (VITAMIN D3) 1000 UNITS CAPS Take  1 capsule by mouth.  . topiramate (TOPAMAX) 50 MG tablet Take 1 tablet (50 mg total) by mouth 2 (two) times daily.  . [DISCONTINUED] atorvastatin (LIPITOR) 40 MG tablet TAKE 1 TABLET BY MOUTH DAILY AT 6 PM. NEED APPOINTMENT FOR MORE REFILLS  . [DISCONTINUED] topiramate (TOPAMAX) 50 MG tablet Take 1 tablet (50 mg total) by mouth 2 (two) times daily.  . [DISCONTINUED] traMADol (ULTRAM) 50 MG tablet Take 1 tablet (50 mg total) by mouth every 8 (eight) hours as needed.   No facility-administered encounter medications on file as of 11/14/2015.          Objective:   Physical Exam  Constitutional: She is oriented to person, place, and time. She appears well-developed and well-nourished.  HENT:  Head: Normocephalic and atraumatic.  Neck: Neck supple. No thyromegaly present.  Cardiovascular: Normal rate, regular rhythm and normal heart sounds.   Pulmonary/Chest: Effort normal and breath sounds normal.  Lymphadenopathy:    She has no cervical adenopathy.  Neurological: She is alert and oriented to person, place, and time.  Skin: Skin is warm and dry.  Psychiatric:  She has a normal mood and affect. Her behavior is normal.          Assessment & Plan:  Migraine HA - Currently well controlled overall. She's had some mild intermittent headaches. Continue to appear May. She concerned like check with her insurance on the cost of the brand.   Hyperlipidemia - We reviewed warning signs and symptoms for side effects of statins and if she experiences these them please let me know. Right now though she is doing well and is asymptomatic. Due to repeat lipid panel.  Impaired fasting glucose-due to recheck hemoglobin A1c. It's been over year since we take a look at this.  Burning on the bottom of feet-unclear etiology. we'll check a B12 level. Also check a thyroid level.  Lab Results  Component Value Date   TSH 1.918 05/21/2014

## 2015-11-23 DIAGNOSIS — M722 Plantar fascial fibromatosis: Secondary | ICD-10-CM | POA: Diagnosis not present

## 2015-11-23 DIAGNOSIS — M79671 Pain in right foot: Secondary | ICD-10-CM | POA: Diagnosis not present

## 2015-12-13 ENCOUNTER — Telehealth: Payer: Self-pay

## 2015-12-13 NOTE — Telephone Encounter (Signed)
Spoke with Virginia Williams at [facility] with new Dx code.

## 2015-12-13 NOTE — Telephone Encounter (Signed)
Call lab and see if we can resubmit with the following codes.: ICD 10 - G57.93.    Then please let patient know that we will try to resubmit under a different code. Please also let her know that it is not that the wrong code was applied itches that they will pay for it under that particular codes we are going to try a different one.

## 2016-01-24 DIAGNOSIS — R195 Other fecal abnormalities: Secondary | ICD-10-CM | POA: Diagnosis not present

## 2016-01-24 DIAGNOSIS — R1011 Right upper quadrant pain: Secondary | ICD-10-CM | POA: Diagnosis not present

## 2016-01-26 DIAGNOSIS — R1011 Right upper quadrant pain: Secondary | ICD-10-CM | POA: Diagnosis not present

## 2016-02-04 IMAGING — CR DG LUMBAR SPINE COMPLETE 4+V
5 series · 5 of 5 positions shown · non-contrast
Comparison: None.

CLINICAL DATA: Low back pain

EXAM:
LUMBAR SPINE - COMPLETE 4+ VIEW

[view not recorded (1 of 5)]
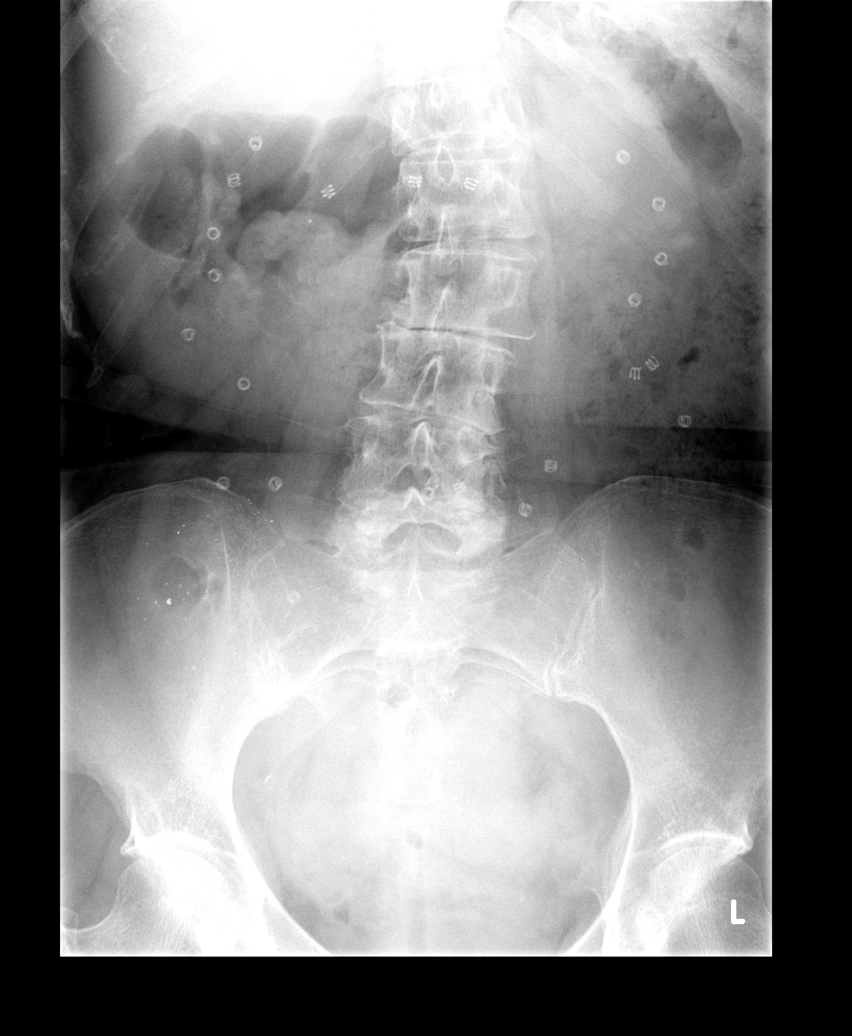

[view not recorded (2 of 5)]
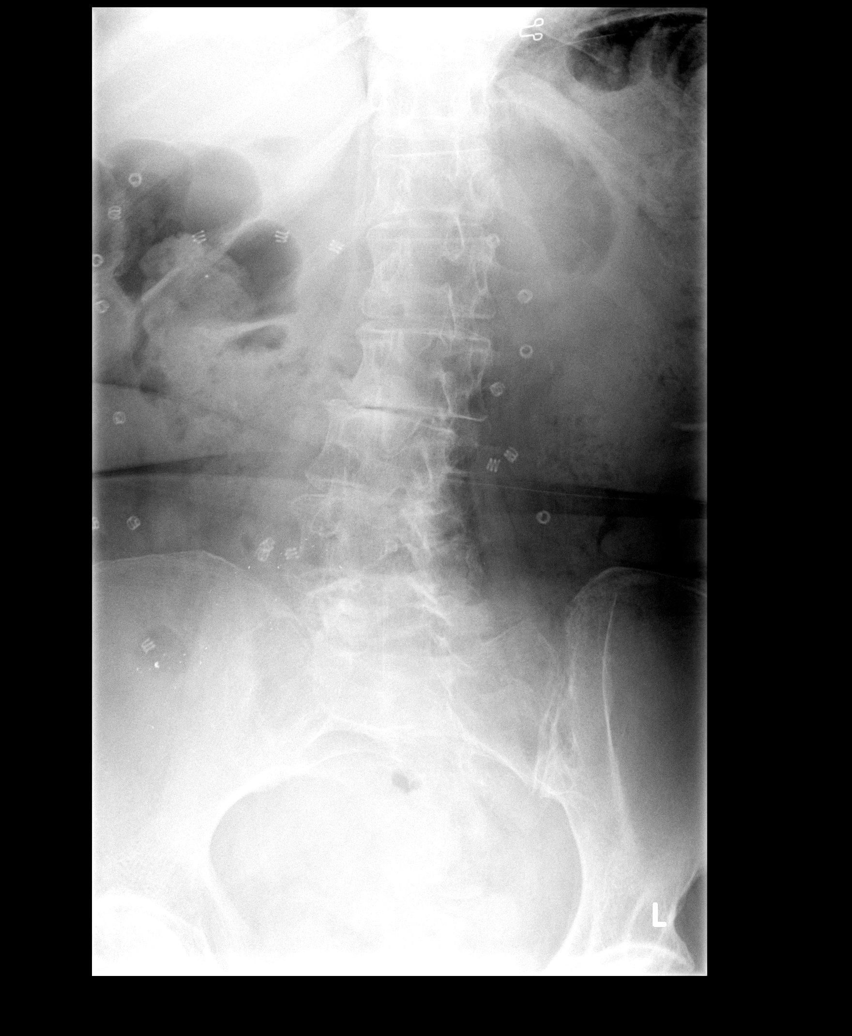

[view not recorded (3 of 5)]
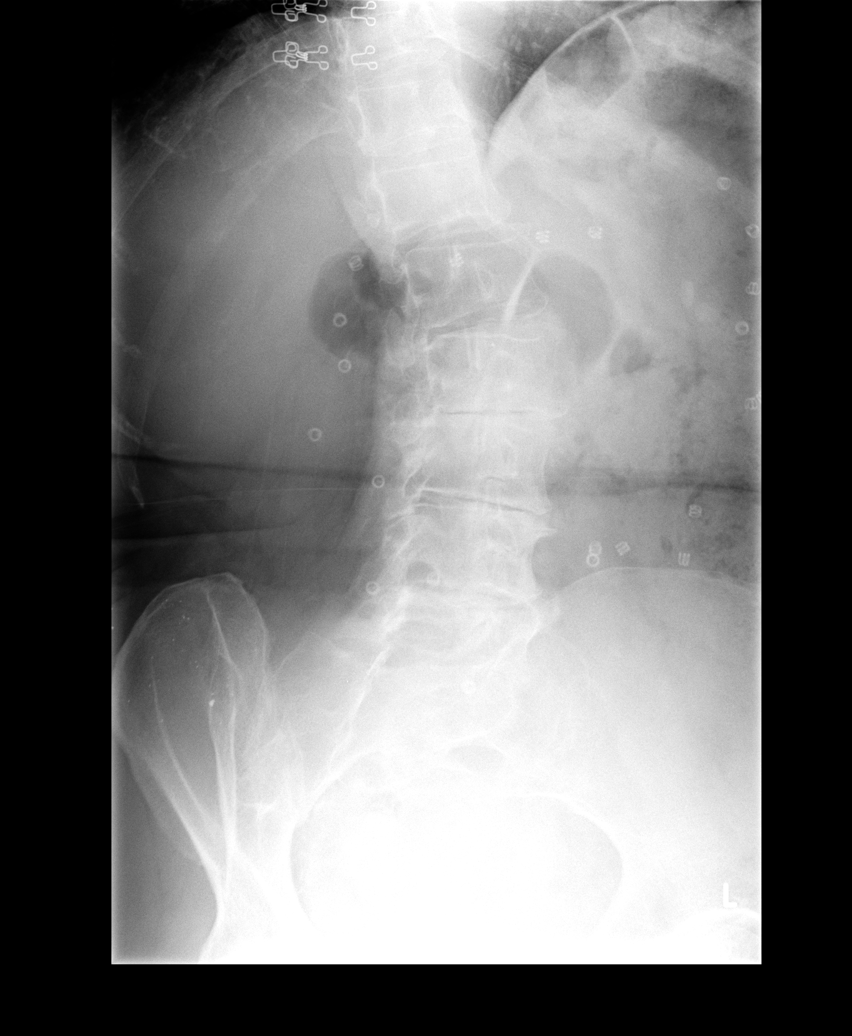

[view not recorded (4 of 5)]
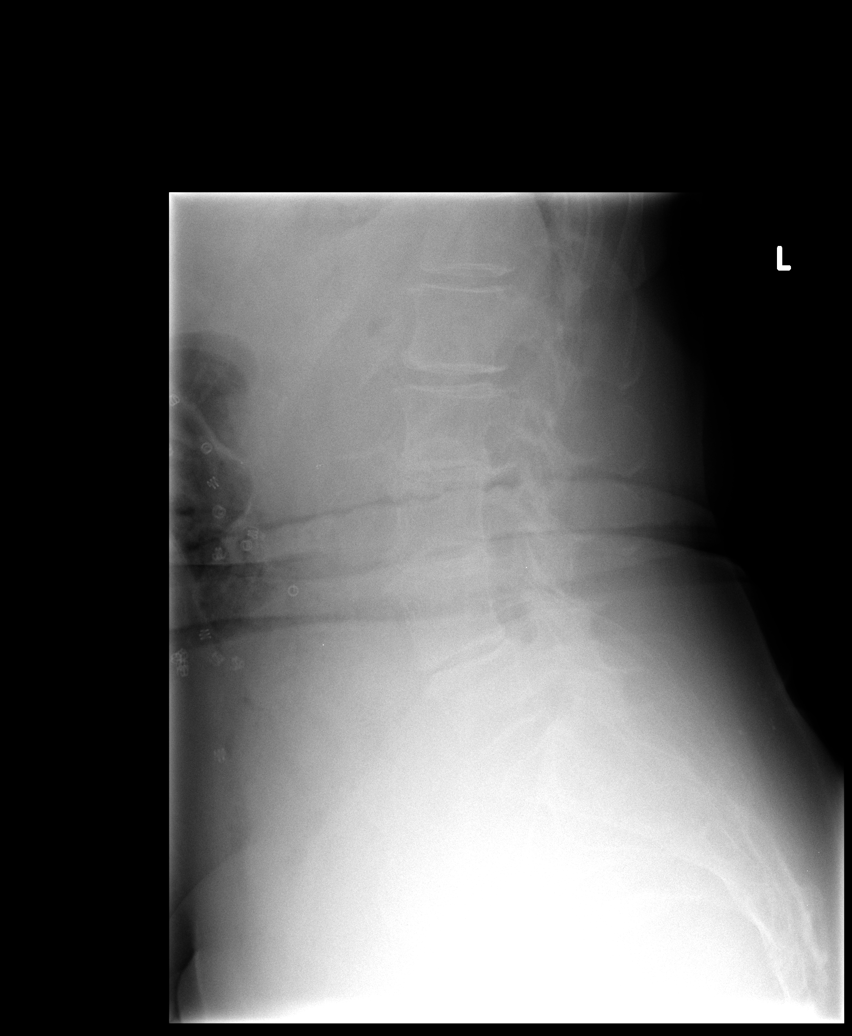

[view not recorded (5 of 5)]
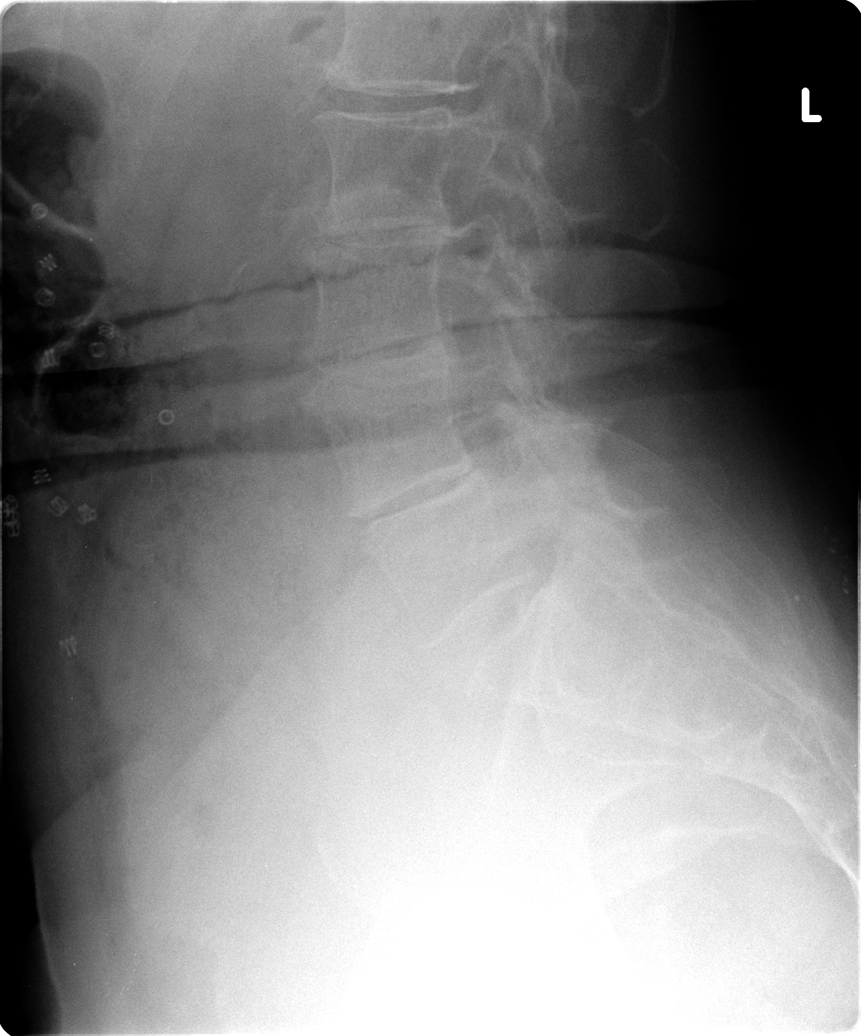

[5 of 5 positions shown; findings below may reference images not displayed]

FINDINGS: Frontal, lateral, spot lumbosacral lateral, and bilateral oblique
views were obtained. There are 5 non-rib-bearing lumbar type
vertebral bodies. There is lumbar levo scoliosis with a rotatory
component. There is no fracture or spondylolisthesis. There is
marked disc space narrowing at L2-3, L3-4, and L4-5. There is milder
narrowing at other levels. There is facet osteoarthritic change to
varying degrees at all levels, most notably at L3-4, L4-5, and L5-S1
bilaterally. There are multiple coils throughout the pelvis.
IMPRESSION: Scoliosis and multilevel osteoarthritic change. No fracture or
spondylolisthesis.

## 2016-03-12 DIAGNOSIS — Z01419 Encounter for gynecological examination (general) (routine) without abnormal findings: Secondary | ICD-10-CM | POA: Diagnosis not present

## 2016-03-12 DIAGNOSIS — Z124 Encounter for screening for malignant neoplasm of cervix: Secondary | ICD-10-CM | POA: Diagnosis not present

## 2016-04-05 DIAGNOSIS — R87629 Unspecified abnormal cytological findings in specimens from vagina: Secondary | ICD-10-CM | POA: Diagnosis not present

## 2016-04-06 DIAGNOSIS — R87629 Unspecified abnormal cytological findings in specimens from vagina: Secondary | ICD-10-CM | POA: Diagnosis not present

## 2016-04-06 DIAGNOSIS — N858 Other specified noninflammatory disorders of uterus: Secondary | ICD-10-CM | POA: Diagnosis not present

## 2016-05-07 ENCOUNTER — Other Ambulatory Visit: Payer: Self-pay | Admitting: Family Medicine

## 2016-05-31 DIAGNOSIS — R112 Nausea with vomiting, unspecified: Secondary | ICD-10-CM | POA: Diagnosis not present

## 2016-05-31 DIAGNOSIS — R109 Unspecified abdominal pain: Secondary | ICD-10-CM | POA: Diagnosis not present

## 2016-06-01 DIAGNOSIS — R197 Diarrhea, unspecified: Secondary | ICD-10-CM | POA: Diagnosis not present

## 2016-06-21 DIAGNOSIS — Z23 Encounter for immunization: Secondary | ICD-10-CM | POA: Diagnosis not present

## 2016-07-05 DIAGNOSIS — N362 Urethral caruncle: Secondary | ICD-10-CM | POA: Diagnosis not present

## 2016-07-14 ENCOUNTER — Other Ambulatory Visit: Payer: Self-pay | Admitting: Family Medicine

## 2016-08-03 ENCOUNTER — Other Ambulatory Visit: Payer: Self-pay | Admitting: Family Medicine

## 2016-08-07 ENCOUNTER — Other Ambulatory Visit: Payer: Self-pay

## 2016-08-07 MED ORDER — ATORVASTATIN CALCIUM 40 MG PO TABS: 40.0000 mg | ORAL_TABLET | Freq: Every day | ORAL | 0 refills | Status: DC

## 2016-08-17 DIAGNOSIS — N362 Urethral caruncle: Secondary | ICD-10-CM | POA: Diagnosis not present

## 2016-08-17 DIAGNOSIS — N8110 Cystocele, unspecified: Secondary | ICD-10-CM | POA: Diagnosis not present

## 2016-09-06 ENCOUNTER — Other Ambulatory Visit: Payer: Self-pay | Admitting: Family Medicine

## 2016-10-11 DIAGNOSIS — M94261 Chondromalacia, right knee: Secondary | ICD-10-CM | POA: Diagnosis not present

## 2016-10-11 DIAGNOSIS — R52 Pain, unspecified: Secondary | ICD-10-CM | POA: Diagnosis not present

## 2016-10-23 DIAGNOSIS — H2513 Age-related nuclear cataract, bilateral: Secondary | ICD-10-CM | POA: Diagnosis not present

## 2016-11-13 ENCOUNTER — Encounter: Payer: Self-pay | Admitting: Family Medicine

## 2016-11-13 ENCOUNTER — Ambulatory Visit (INDEPENDENT_AMBULATORY_CARE_PROVIDER_SITE_OTHER): Payer: Medicare Other | Admitting: Family Medicine

## 2016-11-13 VITALS — BP 111/61 | HR 75 | Ht 63.0 in | Wt 140.0 lb

## 2016-11-13 DIAGNOSIS — T887XXA Unspecified adverse effect of drug or medicament, initial encounter: Secondary | ICD-10-CM | POA: Diagnosis not present

## 2016-11-13 DIAGNOSIS — Z1159 Encounter for screening for other viral diseases: Secondary | ICD-10-CM | POA: Diagnosis not present

## 2016-11-13 DIAGNOSIS — R7301 Impaired fasting glucose: Secondary | ICD-10-CM

## 2016-11-13 DIAGNOSIS — R1011 Right upper quadrant pain: Secondary | ICD-10-CM

## 2016-11-13 DIAGNOSIS — T50905A Adverse effect of unspecified drugs, medicaments and biological substances, initial encounter: Secondary | ICD-10-CM

## 2016-11-13 DIAGNOSIS — E7800 Pure hypercholesterolemia, unspecified: Secondary | ICD-10-CM

## 2016-11-13 LAB — POCT GLYCOSYLATED HEMOGLOBIN (HGB A1C): Hemoglobin A1C: 5.4

## 2016-11-13 LAB — POCT URINALYSIS DIPSTICK
Bilirubin, UA: NEGATIVE
GLUCOSE UA: NEGATIVE
Ketones, UA: NEGATIVE
LEUKOCYTES UA: NEGATIVE
NITRITE UA: NEGATIVE
Protein, UA: NEGATIVE
RBC UA: NEGATIVE
Spec Grav, UA: 1.025
UROBILINOGEN UA: 0.2
pH, UA: 5.5

## 2016-11-13 MED ORDER — PRAVASTATIN SODIUM 40 MG PO TABS: 40.0000 mg | ORAL_TABLET | Freq: Every day | ORAL | 1 refills | Status: DC

## 2016-11-13 NOTE — Progress Notes (Signed)
Subjective:    Patient ID: Virginia Williams, female    DOB: [birth year], 71 y.o.   MRN: [id number]  HPI Hyperlipidemia -currently on atorvastatin 40 mg and getting some myalgias. Due for refills on medication.  Impaired fasting glucose-no increased thirst or urination. No symptoms consistent with hypoglycemia.  She also complains of bilateral low back pain just right around the belt line. She says it's been bothering her for a week or more. She denies any trauma injury heavy lifting twisting bending etc. She says she's worried about her kidneys. She was recently on a round of amoxicillin for a dental infection and actually was having some stomach upset and GI irritation around that time. She denies any constipation and says she has a regular bowel movement daily.  He also has had some intermittent right upper quadrant pain. She said she was diagnosed with gastric ulcer recently and was told to follow-up if her symptoms did not improve. She has not rescheduled follow-up appointment yet.  Review of Systems  BP 111/61   Pulse 75   Ht 5\' 3"  (1.6 m)   Wt 140 lb (63.5 kg)   SpO2 98%   BMI 24.80 kg/m     Allergies  Allergen Reactions  . Atorvastatin Other (See Comments)    Myalgias    Past Medical History:  Diagnosis Date  . C. difficile colitis    post op from cipro  . Hyperlipidemia   . Meningioma ([place])   . Normal stress echocardiogram 09/2005   [facility]  . Staph infection    post op    Past Surgical History:  Procedure Laterality Date  . ATRIAL ABLATION SURGERY     for AVNRT at [place]  . BREAST SURGERY     breast implants- silicone    Social History   Social History  . Marital status: Married    Spouse name: N/A  . Number of children: N/A  . Years of education: N/A   Occupational History  . Not on file.   Social History Main Topics  . Smoking status: Never Smoker  . Smokeless tobacco: Never Used  . Alcohol use No  . Drug use: No  . Sexual  activity: Not on file   Other Topics Concern  . Not on file   Social History Narrative  . No narrative on file    Family History  Problem Relation Age of Onset  . Hypertension Mother     Outpatient Encounter Prescriptions as of 11/13/2016  Medication Sig  . Biotin 5000 MCG CAPS Take by mouth.  . Cholecalciferol (VITAMIN D3) 1000 UNITS CAPS Take 1 capsule by mouth.  . pravastatin (PRAVACHOL) 40 MG tablet Take 1 tablet (40 mg total) by mouth at bedtime.  . topiramate (TOPAMAX) 50 MG tablet TAKE 1 TABLET (50 MG TOTAL) BY MOUTH 2 (TWO) TIMES DAILY.  . [DISCONTINUED] atorvastatin (LIPITOR) 40 MG tablet TAKE 1 TABLET (40 MG TOTAL) BY MOUTH DAILY AT 6 PM. NEED FOLLOW UP APPOINTMENT FOR MORE REFILLS   No facility-administered encounter medications on file as of 11/13/2016.          Objective:   Physical Exam  Constitutional: She is oriented to person, place, and time. She appears well-developed and well-nourished.  HENT:  Head: Normocephalic and atraumatic.  Cardiovascular: Normal rate, regular rhythm and normal heart sounds.   Pulmonary/Chest: Effort normal and breath sounds normal.  Neurological: She is alert and oriented to person, place, and time.  Skin: Skin is  warm and dry.  Psychiatric: She has a normal mood and affect. Her behavior is normal.   Musculoskeletal: She is tender along the paraspinous muscles just above the hips bilaterally. Normal flexion rotation right and left and normal side bending. Abdomen: Soft, mildly tender in the right upper quadrant close to the epigastric area., normal bowel sounds. No rebound or guarding. No masses or organomegaly.      Assessment & Plan:  Hyperlipidemia-Will change atorvastatin to pravastatin. She can go for CMP and lipid panel in about 3-4 weeks when she starts the new medication.  Medication side effect - will add atorvastatin to intolerance list. Change to pravastatin.  IFG - Well controlled. Continue current regimen. Follow  up in  6 months.  A1C of 5.4 today.    Low back pain-most likely musculoskeletal based on exam today. Handout on some stretches/exercise today. Recommend heating pad. Avoid NSAIDs since recently was diagnosed with a gastric ulcer. We'll do a urinalysis today. UA is normal.   Right upper quadrant pain-may be related to her ulcer. Just a little tender on exam but no masses or organomegaly. Recommend that she get back in with her GI since her symptoms have been persistent.

## 2016-11-13 NOTE — Patient Instructions (Signed)
Go to the lab in about 3 weeks after you start the new cholesterol pill.

## 2016-12-05 DIAGNOSIS — Z1231 Encounter for screening mammogram for malignant neoplasm of breast: Secondary | ICD-10-CM | POA: Diagnosis not present

## 2016-12-05 LAB — HM MAMMOGRAPHY

## 2016-12-11 ENCOUNTER — Encounter: Payer: Self-pay | Admitting: Family Medicine

## 2016-12-12 DIAGNOSIS — E7800 Pure hypercholesterolemia, unspecified: Secondary | ICD-10-CM | POA: Diagnosis not present

## 2016-12-12 DIAGNOSIS — Z1159 Encounter for screening for other viral diseases: Secondary | ICD-10-CM | POA: Diagnosis not present

## 2016-12-12 DIAGNOSIS — R7301 Impaired fasting glucose: Secondary | ICD-10-CM | POA: Diagnosis not present

## 2016-12-13 LAB — LIPID PANEL W/REFLEX DIRECT LDL
CHOLESTEROL: 183 mg/dL (ref <200)
HDL: 62 mg/dL (ref >50)
LDL-CHOLESTEROL: 104 mg/dL — AB
NON-HDL CHOLESTEROL (CALC): 121 mg/dL (ref <130)
Total CHOL/HDL Ratio: 3 Ratio (ref <5.0)
Triglycerides: 84 mg/dL (ref <150)

## 2016-12-13 LAB — COMPLETE METABOLIC PANEL WITH GFR
ALT: 17 U/L (ref 6–29)
AST: 18 U/L (ref 10–35)
Albumin: 3.9 g/dL (ref 3.6–5.1)
Alkaline Phosphatase: 67 U/L (ref 33–130)
BILIRUBIN TOTAL: 0.5 mg/dL (ref 0.2–1.2)
BUN: 17 mg/dL (ref 7–25)
CHLORIDE: 112 mmol/L — AB (ref 98–110)
CO2: 26 mmol/L (ref 20–31)
CREATININE: 0.91 mg/dL (ref 0.60–0.93)
Calcium: 9 mg/dL (ref 8.6–10.4)
GFR, EST AFRICAN AMERICAN: 73 mL/min (ref >=60)
GFR, Est Non African American: 64 mL/min (ref >=60)
Glucose, Bld: 89 mg/dL (ref 65–99)
Potassium: 4.4 mmol/L (ref 3.5–5.3)
Sodium: 142 mmol/L (ref 135–146)
Total Protein: 6.2 g/dL (ref 6.1–8.1)

## 2016-12-13 LAB — HEPATITIS C ANTIBODY: HCV AB: NEGATIVE

## 2017-01-02 ENCOUNTER — Other Ambulatory Visit: Payer: Self-pay | Admitting: Family Medicine

## 2017-04-19 ENCOUNTER — Ambulatory Visit (INDEPENDENT_AMBULATORY_CARE_PROVIDER_SITE_OTHER): Payer: Medicare Other

## 2017-04-19 ENCOUNTER — Other Ambulatory Visit: Payer: Self-pay | Admitting: Family Medicine

## 2017-04-19 ENCOUNTER — Ambulatory Visit (INDEPENDENT_AMBULATORY_CARE_PROVIDER_SITE_OTHER): Payer: Medicare Other | Admitting: Family Medicine

## 2017-04-19 VITALS — BP 138/70 | HR 68 | Wt 137.0 lb

## 2017-04-19 DIAGNOSIS — M545 Low back pain, unspecified: Secondary | ICD-10-CM

## 2017-04-19 DIAGNOSIS — M47896 Other spondylosis, lumbar region: Secondary | ICD-10-CM | POA: Diagnosis not present

## 2017-04-19 DIAGNOSIS — R1031 Right lower quadrant pain: Secondary | ICD-10-CM

## 2017-04-19 DIAGNOSIS — M47817 Spondylosis without myelopathy or radiculopathy, lumbosacral region: Secondary | ICD-10-CM | POA: Diagnosis not present

## 2017-04-19 LAB — CBC WITH DIFFERENTIAL/PLATELET
BASOS PCT: 1 %
Basophils Absolute: 71 cells/uL (ref 0–200)
EOS ABS: 142 {cells}/uL (ref 15–500)
Eosinophils Relative: 2 %
HEMATOCRIT: 42.5 % (ref 35.0–45.0)
Hemoglobin: 14 g/dL (ref 11.7–15.5)
Lymphocytes Relative: 45 %
Lymphs Abs: 3195 cells/uL (ref 850–3900)
MCH: 30.1 pg (ref 27.0–33.0)
MCHC: 32.9 g/dL (ref 32.0–36.0)
MCV: 91.4 fL (ref 80.0–100.0)
MONO ABS: 639 {cells}/uL (ref 200–950)
MPV: 9.5 fL (ref 7.5–12.5)
Monocytes Relative: 9 %
NEUTROS ABS: 3053 {cells}/uL (ref 1500–7800)
Neutrophils Relative %: 43 %
Platelets: 414 10*3/uL — ABNORMAL HIGH (ref 140–400)
RBC: 4.65 MIL/uL (ref 3.80–5.10)
RDW: 13.5 % (ref 11.0–15.0)
WBC: 7.1 10*3/uL (ref 3.8–10.8)

## 2017-04-19 LAB — POCT URINALYSIS DIPSTICK
BILIRUBIN UA: NEGATIVE
GLUCOSE UA: NEGATIVE
Ketones, UA: NEGATIVE
Leukocytes, UA: NEGATIVE
NITRITE UA: NEGATIVE
Protein, UA: NEGATIVE
RBC UA: NEGATIVE
SPEC GRAV UA: 1.015 (ref 1.010–1.025)
Urobilinogen, UA: 0.2 E.U./dL
pH, UA: 7 (ref 5.0–8.0)

## 2017-04-19 MED ORDER — PREDNISONE 20 MG PO TABS: 40.0000 mg | ORAL_TABLET | Freq: Every day | ORAL | 0 refills | Status: DC

## 2017-04-19 NOTE — Progress Notes (Unsigned)
pred 

## 2017-04-19 NOTE — Progress Notes (Signed)
Subjective:    Patient ID: Virginia Williams, female    DOB: 1945-10-08, 71 y.o.   MRN: [id number]  HPI 71 year old female comes in acutely for low back pain.  Having low right back x 2 months.   She says the pain is constant but it's been gradually getting worse. To the point where she really can't find a comparable position to sleep at night. She's been mostly using Tylenol for pain control as she's been told not to use NSAIDs because of prior history of gastric ulcers. She was leaning against the sink in the room today she also noticed that she got a sharp pain in her right lower quadrant. She says the emesis has a burning sensation in the pain in her back also has a little bit of a burning sensation. No fevers chills or sweats.  Last film was from 2015: 06/07/2014 2:08 PM 06/07/2014 2:10 PM  PACS Images   Show images for DG Lumbar Spine Complete  Study Result   CLINICAL DATA:  Low back pain  EXAM: LUMBAR SPINE - COMPLETE 4+ VIEW  COMPARISON:  None.  FINDINGS: Frontal, lateral, spot lumbosacral lateral, and bilateral oblique views were obtained. There are 5 non-rib-bearing lumbar type vertebral bodies. There is lumbar levo scoliosis with a rotatory component. There is no fracture or spondylolisthesis. There is marked disc space narrowing at L2-3, L3-4, and L4-5. There is milder narrowing at other levels. There is facet osteoarthritic change to varying degrees at all levels, most notably at L3-4, L4-5, and L5-S1 bilaterally. There are multiple coils throughout the pelvis.  IMPRESSION: Scoliosis and multilevel osteoarthritic change. No fracture or spondylolisthesis.   Electronically Signed   By: Lowella Grip M.D.   On: 06/07/2014 15:00      Review of Systems  BP 138/70   Pulse 68   Wt 137 lb (62.1 kg)   BMI 24.27 kg/m     Allergies  Allergen Reactions  . Atorvastatin Other (See Comments)    Myalgias    Past Medical History:  Diagnosis Date  .  C. difficile colitis    post op from cipro  . Hyperlipidemia   . Meningioma ([place])   . Normal stress echocardiogram 09/2005   [facility]  . Staph infection    post op    Past Surgical History:  Procedure Laterality Date  . ATRIAL ABLATION SURGERY     for AVNRT at [place]  . BREAST SURGERY     breast implants- silicone    Social History   Social History  . Marital status: Married    Spouse name: N/A  . Number of children: N/A  . Years of education: N/A   Occupational History  . Not on file.   Social History Main Topics  . Smoking status: Never Smoker  . Smokeless tobacco: Never Used  . Alcohol use No  . Drug use: No  . Sexual activity: Not on file   Other Topics Concern  . Not on file   Social History Narrative  . No narrative on file    Family History  Problem Relation Age of Onset  . Hypertension Mother     Outpatient Encounter Prescriptions as of 04/19/2017  Medication Sig  . Biotin 5000 MCG CAPS Take by mouth.  . Cholecalciferol (VITAMIN D3) 1000 UNITS CAPS Take 1 capsule by mouth.  . pravastatin (PRAVACHOL) 40 MG tablet Take 1 tablet (40 mg total) by mouth at bedtime.  . topiramate (TOPAMAX) 50 MG tablet  TAKE 1 TABLET (50 MG TOTAL) BY MOUTH 2 (TWO) TIMES DAILY.  . vitamin B-12 (CYANOCOBALAMIN) 500 MCG tablet Take by mouth.   No facility-administered encounter medications on file as of 04/19/2017.          Objective:   Physical Exam  Constitutional: She is oriented to person, place, and time. She appears well-developed and well-nourished.  HENT:  Head: Normocephalic and atraumatic.  Eyes: Conjunctivae and EOM are normal.  Cardiovascular: Normal rate.   Pulmonary/Chest: Effort normal.  Abdominal: Soft. Bowel sounds are normal. She exhibits no distension and no mass. There is tenderness. There is no rebound and no guarding.  Tender to palpation in the right lower quadrant. No rebound guarding or masses.  Musculoskeletal:  Normal lumbar  flexion extension rotation right and left and side bending. She did have some discomfort in the right low back with side bending to the left. Knee, hip, ankle strength is 5 out of 5 bilaterally. Negative straight leg raise bilaterally. Patellar reflexes 2+.  Neurological: She is alert and oriented to person, place, and time.  Skin: Skin is dry. No pallor.  Psychiatric: She has a normal mood and affect. Her behavior is normal.  Vitals reviewed.         Assessment & Plan:  Right low back pain-I really feel like it's muscular skeletal is unable to press on the area just above the edge of the sacrum on the right side and it's extremely tender. No sign of rash or shingles etc. As far as her back is concerned we'll go ahead and get x-rays Since she feels her pain has been progressing.  Right lower quadrant tenderness/pain-  She is also tender in the right lower quadrant though so we'll check a CBC and a UA.Urinalysis is normal.

## 2017-05-16 ENCOUNTER — Ambulatory Visit (INDEPENDENT_AMBULATORY_CARE_PROVIDER_SITE_OTHER): Payer: Medicare Other | Admitting: Family Medicine

## 2017-05-16 ENCOUNTER — Encounter: Payer: Self-pay | Admitting: Family Medicine

## 2017-05-16 VITALS — BP 133/75 | HR 73 | Wt 138.0 lb

## 2017-05-16 DIAGNOSIS — E7800 Pure hypercholesterolemia, unspecified: Secondary | ICD-10-CM | POA: Diagnosis not present

## 2017-05-16 DIAGNOSIS — M545 Low back pain: Secondary | ICD-10-CM

## 2017-05-16 DIAGNOSIS — G8929 Other chronic pain: Secondary | ICD-10-CM

## 2017-05-16 DIAGNOSIS — R7301 Impaired fasting glucose: Secondary | ICD-10-CM | POA: Diagnosis not present

## 2017-05-16 DIAGNOSIS — M461 Sacroiliitis, not elsewhere classified: Secondary | ICD-10-CM | POA: Diagnosis not present

## 2017-05-16 LAB — POCT GLYCOSYLATED HEMOGLOBIN (HGB A1C): Hemoglobin A1C: 5.6
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
## 2017-05-21 DIAGNOSIS — Z23 Encounter for immunization: Secondary | ICD-10-CM | POA: Diagnosis not present

## 2017-05-21 DIAGNOSIS — E7800 Pure hypercholesterolemia, unspecified: Secondary | ICD-10-CM | POA: Diagnosis not present

## 2017-05-21 LAB — COMPLETE METABOLIC PANEL WITH GFR
AG RATIO: 2 (calc) (ref 1.0–2.5)
ALBUMIN MSPROF: 4.3 g/dL (ref 3.6–5.1)
ALKALINE PHOSPHATASE (APISO): 79 U/L (ref 33–130)
ALT: 13 U/L (ref 6–29)
AST: 17 U/L (ref 10–35)
BUN: 15 mg/dL (ref 7–25)
CO2: 22 mmol/L (ref 20–32)
CREATININE: 0.92 mg/dL (ref 0.60–0.93)
Calcium: 9.5 mg/dL (ref 8.6–10.4)
Chloride: 108 mmol/L (ref 98–110)
GFR, EST NON AFRICAN AMERICAN: 63 mL/min/{1.73_m2} (ref >=60)
GFR, Est African American: 73 mL/min/{1.73_m2} (ref >=60)
GLOBULIN: 2.2 g/dL (ref 1.9–3.7)
Glucose, Bld: 87 mg/dL (ref 65–99)
Potassium: 4.2 mmol/L (ref 3.5–5.3)
SODIUM: 141 mmol/L (ref 135–146)
Total Bilirubin: 0.7 mg/dL (ref 0.2–1.2)
Total Protein: 6.5 g/dL (ref 6.1–8.1)

## 2017-05-21 LAB — LIPID PANEL W/REFLEX DIRECT LDL
CHOL/HDL RATIO: 3.6 (calc) (ref <5.0)
Cholesterol: 232 mg/dL — ABNORMAL HIGH (ref <200)
HDL: 64 mg/dL (ref >50)
LDL Cholesterol (Calc): 143 mg/dL (calc) — ABNORMAL HIGH
NON-HDL CHOLESTEROL (CALC): 168 mg/dL — AB (ref <130)
TRIGLYCERIDES: 123 mg/dL (ref <150)

## 2017-05-23 DIAGNOSIS — R1031 Right lower quadrant pain: Secondary | ICD-10-CM | POA: Diagnosis not present

## 2017-05-23 DIAGNOSIS — R928 Other abnormal and inconclusive findings on diagnostic imaging of breast: Secondary | ICD-10-CM | POA: Diagnosis not present

## 2017-06-18 ENCOUNTER — Telehealth: Payer: Self-pay | Admitting: *Deleted

## 2017-06-18 DIAGNOSIS — R1031 Right lower quadrant pain: Secondary | ICD-10-CM

## 2017-06-18 NOTE — Telephone Encounter (Signed)
Pt called and stated that she is still having the abdominal pain and would like to move forward with having the US done that Dr. Madilyn Fireman suggested that she have done some time ago.  Will fwd to pcp for advice.Audelia Hives Honor

## 2017-06-21 ENCOUNTER — Ambulatory Visit (INDEPENDENT_AMBULATORY_CARE_PROVIDER_SITE_OTHER): Payer: Medicare Other

## 2017-06-21 DIAGNOSIS — R197 Diarrhea, unspecified: Secondary | ICD-10-CM | POA: Diagnosis not present

## 2017-06-21 DIAGNOSIS — R110 Nausea: Secondary | ICD-10-CM | POA: Diagnosis not present

## 2017-06-21 DIAGNOSIS — R1031 Right lower quadrant pain: Secondary | ICD-10-CM

## 2017-08-12 ENCOUNTER — Other Ambulatory Visit: Payer: Self-pay | Admitting: Family Medicine

## 2017-08-12 MED ORDER — AMBULATORY NON FORMULARY MEDICATION: 0 refills | Status: DC

## 2017-08-12 NOTE — Progress Notes (Signed)
Printed prescription for shingles vaccine to get at the pharmacy.  Beatrice Lecher, MD

## 2017-08-15 ENCOUNTER — Other Ambulatory Visit: Payer: Self-pay | Admitting: *Deleted

## 2017-08-15 ENCOUNTER — Other Ambulatory Visit: Payer: Self-pay

## 2017-08-15 DIAGNOSIS — G43909 Migraine, unspecified, not intractable, without status migrainosus: Secondary | ICD-10-CM

## 2017-08-15 MED ORDER — TOPIRAMATE 50 MG PO TABS: 50.0000 mg | ORAL_TABLET | Freq: Two times a day (BID) | ORAL | 1 refills | Status: DC

## 2017-08-15 NOTE — Progress Notes (Signed)
Refilled to [facility] and cancelled the one at [facility].

## 2017-08-28 ENCOUNTER — Telehealth: Payer: Self-pay | Admitting: Family Medicine

## 2017-08-28 MED ORDER — SIMVASTATIN 20 MG PO TABS: 20.0000 mg | ORAL_TABLET | Freq: Every day | ORAL | 3 refills | Status: DC

## 2017-08-28 NOTE — Telephone Encounter (Signed)
The notification from pharmacy that pravastatin was going to be too costly.  The made several other recommendations including simvastatin for $6 and atorvastatin for $10.  We will send over new prescription to pharmacy.  [facility] [address].

## 2017-09-05 ENCOUNTER — Telehealth: Payer: Self-pay | Admitting: Family Medicine

## 2017-09-05 NOTE — Telephone Encounter (Signed)
Not urgent, will route to PCP

## 2017-09-05 NOTE — Telephone Encounter (Signed)
Please call pt and let her know zetia hasn't been showne to reduce risk of Heart attack and stroke like statins. I would encouraged her to try the statin and if she feels she is developinh symptoms then she can stop it immediately.

## 2017-09-05 NOTE — Telephone Encounter (Signed)
Pt would like an Rx sent to ezetimibe (ZETIA) 10 MG tablet instead of taking a statin for her cholesterol. Her brother got very sick from taking a statin in the past. Pharmacy in chart correct, will route.

## 2017-09-06 NOTE — Telephone Encounter (Signed)
Pt advised of PCP's recommendation, she will try the statin.

## 2017-09-25 DIAGNOSIS — R1011 Right upper quadrant pain: Secondary | ICD-10-CM | POA: Diagnosis not present

## 2017-09-25 DIAGNOSIS — K449 Diaphragmatic hernia without obstruction or gangrene: Secondary | ICD-10-CM | POA: Diagnosis not present

## 2017-10-01 DIAGNOSIS — R1011 Right upper quadrant pain: Secondary | ICD-10-CM | POA: Diagnosis not present

## 2017-10-11 DIAGNOSIS — G8929 Other chronic pain: Secondary | ICD-10-CM | POA: Diagnosis not present

## 2017-10-11 DIAGNOSIS — R1011 Right upper quadrant pain: Secondary | ICD-10-CM | POA: Diagnosis not present

## 2017-10-18 DIAGNOSIS — Z8719 Personal history of other diseases of the digestive system: Secondary | ICD-10-CM | POA: Diagnosis not present

## 2017-10-18 DIAGNOSIS — N200 Calculus of kidney: Secondary | ICD-10-CM | POA: Diagnosis not present

## 2017-10-18 DIAGNOSIS — R1011 Right upper quadrant pain: Secondary | ICD-10-CM | POA: Diagnosis not present

## 2017-10-18 DIAGNOSIS — I471 Supraventricular tachycardia: Secondary | ICD-10-CM | POA: Diagnosis not present

## 2017-10-18 DIAGNOSIS — K439 Ventral hernia without obstruction or gangrene: Secondary | ICD-10-CM | POA: Diagnosis not present

## 2017-10-18 DIAGNOSIS — K449 Diaphragmatic hernia without obstruction or gangrene: Secondary | ICD-10-CM | POA: Diagnosis not present

## 2017-10-18 DIAGNOSIS — Z9889 Other specified postprocedural states: Secondary | ICD-10-CM | POA: Diagnosis not present

## 2017-10-18 DIAGNOSIS — N83201 Unspecified ovarian cyst, right side: Secondary | ICD-10-CM | POA: Diagnosis not present

## 2017-10-18 DIAGNOSIS — K828 Other specified diseases of gallbladder: Secondary | ICD-10-CM | POA: Diagnosis not present

## 2017-10-21 DIAGNOSIS — I471 Supraventricular tachycardia: Secondary | ICD-10-CM | POA: Diagnosis not present

## 2017-10-21 DIAGNOSIS — R1011 Right upper quadrant pain: Secondary | ICD-10-CM | POA: Diagnosis not present

## 2017-10-24 DIAGNOSIS — R1030 Lower abdominal pain, unspecified: Secondary | ICD-10-CM | POA: Diagnosis not present

## 2017-10-24 DIAGNOSIS — N83201 Unspecified ovarian cyst, right side: Secondary | ICD-10-CM | POA: Diagnosis not present

## 2017-10-28 DIAGNOSIS — N83201 Unspecified ovarian cyst, right side: Secondary | ICD-10-CM | POA: Diagnosis not present

## 2017-10-28 DIAGNOSIS — H2513 Age-related nuclear cataract, bilateral: Secondary | ICD-10-CM | POA: Diagnosis not present

## 2017-10-28 DIAGNOSIS — H5213 Myopia, bilateral: Secondary | ICD-10-CM | POA: Diagnosis not present

## 2017-10-31 DIAGNOSIS — R1011 Right upper quadrant pain: Secondary | ICD-10-CM | POA: Diagnosis not present

## 2017-10-31 DIAGNOSIS — Z9081 Acquired absence of spleen: Secondary | ICD-10-CM | POA: Diagnosis not present

## 2017-10-31 DIAGNOSIS — R1900 Intra-abdominal and pelvic swelling, mass and lump, unspecified site: Secondary | ICD-10-CM | POA: Diagnosis not present

## 2017-11-05 DIAGNOSIS — N83201 Unspecified ovarian cyst, right side: Secondary | ICD-10-CM | POA: Diagnosis not present

## 2017-11-05 DIAGNOSIS — D3911 Neoplasm of uncertain behavior of right ovary: Secondary | ICD-10-CM | POA: Diagnosis not present

## 2017-11-13 ENCOUNTER — Encounter: Payer: Self-pay | Admitting: Family Medicine

## 2017-11-13 ENCOUNTER — Ambulatory Visit (INDEPENDENT_AMBULATORY_CARE_PROVIDER_SITE_OTHER): Payer: Medicare Other | Admitting: Family Medicine

## 2017-11-13 VITALS — BP 131/76 | HR 76 | Ht 63.0 in | Wt 141.0 lb

## 2017-11-13 DIAGNOSIS — E7800 Pure hypercholesterolemia, unspecified: Secondary | ICD-10-CM

## 2017-11-13 DIAGNOSIS — R1011 Right upper quadrant pain: Secondary | ICD-10-CM | POA: Diagnosis not present

## 2017-11-13 DIAGNOSIS — R7301 Impaired fasting glucose: Secondary | ICD-10-CM | POA: Diagnosis not present

## 2017-11-13 LAB — COMPLETE METABOLIC PANEL WITH GFR
AG Ratio: 1.8 (calc) (ref 1.0–2.5)
ALBUMIN MSPROF: 4.3 g/dL (ref 3.6–5.1)
ALKALINE PHOSPHATASE (APISO): 63 U/L (ref 33–130)
ALT: 17 U/L (ref 6–29)
AST: 19 U/L (ref 10–35)
BILIRUBIN TOTAL: 0.6 mg/dL (ref 0.2–1.2)
BUN / CREAT RATIO: 16 (calc) (ref 6–22)
BUN: 15 mg/dL (ref 7–25)
CHLORIDE: 110 mmol/L (ref 98–110)
CO2: 25 mmol/L (ref 20–32)
Calcium: 9.4 mg/dL (ref 8.6–10.4)
Creat: 0.94 mg/dL — ABNORMAL HIGH (ref 0.60–0.93)
GFR, Est African American: 70 mL/min/{1.73_m2} (ref >=60)
GFR, Est Non African American: 61 mL/min/{1.73_m2} (ref >=60)
GLOBULIN: 2.4 g/dL (ref 1.9–3.7)
GLUCOSE: 90 mg/dL (ref 65–99)
POTASSIUM: 4.4 mmol/L (ref 3.5–5.3)
SODIUM: 142 mmol/L (ref 135–146)
Total Protein: 6.7 g/dL (ref 6.1–8.1)

## 2017-11-13 LAB — LIPID PANEL W/REFLEX DIRECT LDL
CHOLESTEROL: 198 mg/dL (ref <200)
HDL: 62 mg/dL (ref >50)
LDL Cholesterol (Calc): 116 mg/dL (calc) — ABNORMAL HIGH
Non-HDL Cholesterol (Calc): 136 mg/dL (calc) — ABNORMAL HIGH (ref <130)
Total CHOL/HDL Ratio: 3.2 (calc) (ref <5.0)
Triglycerides: 102 mg/dL (ref <150)

## 2017-11-13 LAB — POCT GLYCOSYLATED HEMOGLOBIN (HGB A1C): Hemoglobin A1C: 5.6

## 2017-11-13 MED ORDER — AMBULATORY NON FORMULARY MEDICATION: 0 refills | Status: DC

## 2017-11-13 NOTE — Progress Notes (Signed)
Subjective:    CC: IFG   HPI:  Impaired fasting glucose-no increased thirst or urination. No symptoms consistent with hypoglycemia.  Hyperlipidemia-currently on simvastatin.  We last checked her labs in September her cholesterol is still elevated but she admitted she was not taking the medication consistently.  She wants a new prescription printed for the tetanus and the shingles vaccine.  She is still having some right upper quadrant pain but thinks it is probably from the mesh.  She had a consultation with surgery and they did not recommend having her gallbladder removed.  They did a CT scan that ruled out gallbladder disease, bowel inflammation, recurrence of hernia etc.  But she is still having some discomfort.  She was found to have a right ovarian cyst which is being followed by her OB/GYN.  She occasionally does get some burning pain in that right lower quadrant that radiates around to her back.  Past medical history, Surgical history, Family history not pertinant except as noted below, Social history, Allergies, and medications have been entered into the medical record, reviewed, and corrections made.   Review of Systems: No fevers, chills, night sweats, weight loss, chest pain, or shortness of breath.   Objective:    General: Well Developed, well nourished, and in no acute distress.  Neuro: Alert and oriented x3, extra-ocular muscles intact, sensation grossly intact.  HEENT: Normocephalic, atraumatic  Skin: Warm and dry, no rashes. Cardiac: Regular rate and rhythm, no murmurs rubs or gallops, no lower extremity edema.  Respiratory: Clear to auscultation bilaterally. Not using accessory muscles, speaking in full sentences.   Impression and Recommendations:   IFG -well controlled.  Hemoglobin A1c of 5.6 today which looks great.  Recheck in 6 months.  Hyperlipidemia-due to recheck the cholesterol levels now that she is taking her medication regularly.  We will also check liver  enzymes.  Right upper quadrant pain-at least we have ruled out some potential harmful causes.  Even though it does not exactly explain her pain.  I still think it could possibly be coming from the mesh.  But the mesh is in place and there is no recurrence of hernia which is reassuring.    New Rx printed for the tetanus and shingles vaccine.

## 2017-12-16 DIAGNOSIS — N83201 Unspecified ovarian cyst, right side: Secondary | ICD-10-CM | POA: Diagnosis not present

## 2018-01-28 ENCOUNTER — Other Ambulatory Visit: Payer: Self-pay | Admitting: Family Medicine

## 2018-01-28 DIAGNOSIS — Z1231 Encounter for screening mammogram for malignant neoplasm of breast: Secondary | ICD-10-CM | POA: Diagnosis not present

## 2018-01-28 LAB — HM MAMMOGRAPHY

## 2018-02-19 ENCOUNTER — Encounter: Payer: Self-pay | Admitting: Family Medicine

## 2018-04-10 ENCOUNTER — Other Ambulatory Visit: Payer: Self-pay

## 2018-04-28 DIAGNOSIS — R102 Pelvic and perineal pain: Secondary | ICD-10-CM | POA: Diagnosis not present

## 2018-04-28 DIAGNOSIS — N952 Postmenopausal atrophic vaginitis: Secondary | ICD-10-CM | POA: Diagnosis not present

## 2018-04-28 DIAGNOSIS — M545 Low back pain: Secondary | ICD-10-CM | POA: Diagnosis not present

## 2018-04-28 DIAGNOSIS — N83201 Unspecified ovarian cyst, right side: Secondary | ICD-10-CM | POA: Diagnosis not present

## 2018-06-06 DIAGNOSIS — Z23 Encounter for immunization: Secondary | ICD-10-CM | POA: Diagnosis not present

## 2018-06-25 DIAGNOSIS — N952 Postmenopausal atrophic vaginitis: Secondary | ICD-10-CM | POA: Diagnosis not present

## 2018-06-25 DIAGNOSIS — N83201 Unspecified ovarian cyst, right side: Secondary | ICD-10-CM | POA: Diagnosis not present

## 2018-08-04 ENCOUNTER — Other Ambulatory Visit: Payer: Self-pay | Admitting: Family Medicine

## 2018-10-22 ENCOUNTER — Ambulatory Visit (INDEPENDENT_AMBULATORY_CARE_PROVIDER_SITE_OTHER): Payer: Medicare Other | Admitting: Family Medicine

## 2018-10-22 ENCOUNTER — Encounter: Payer: Self-pay | Admitting: Family Medicine

## 2018-10-22 VITALS — BP 127/76 | HR 85 | Temp 97.7°F | Ht 63.0 in | Wt 140.0 lb

## 2018-10-22 DIAGNOSIS — J0110 Acute frontal sinusitis, unspecified: Secondary | ICD-10-CM

## 2018-10-22 DIAGNOSIS — T50905A Adverse effect of unspecified drugs, medicaments and biological substances, initial encounter: Secondary | ICD-10-CM

## 2018-10-22 DIAGNOSIS — R51 Headache: Secondary | ICD-10-CM | POA: Diagnosis not present

## 2018-10-22 DIAGNOSIS — R519 Headache, unspecified: Secondary | ICD-10-CM

## 2018-10-22 NOTE — Addendum Note (Signed)
Addended by: Beatrice Lecher D on: 10/22/2018 11:00 AM   Modules accepted: Orders, Level of Service

## 2018-10-22 NOTE — Progress Notes (Addendum)
Acute Office Visit  Subjective:    Patient ID: Virginia Williams, female    DOB: [birth year], 73 y.o.   MRN: [id number]  Chief Complaint  Patient presents with  . Nasal Congestion    HPI Patient is in today for Cough and nasal congestion x 3 days.  She has had some bilateral ear pain and some sore throat.  + HA.  She said she is had a lot of facial pressure and pain.  She says normally if she gets a cold after taking some Alka-Seltzer it goes away within a couple days but this time it does not seem to be responding.  She thinks she may have run a temperature yesterday but she is not sure.  No GI symptoms.  She has not tried anything else besides the Alka-Seltzer.  Also wonders let me know that she quit taking her simvastatin.  She felt like it was causing some headaches and she is feels like after having stopped it a couple months ago her headaches have been better.  He just reports that she has been getting a lot of discomfort and pain in the top of her head where she previously had surgery.  It was unclear what type of surgery she actually had I did not see it in my historical information.  But she just feels like maybe there is some scar tissue or something going on and feels like that is where the pain has been when she takes the simvastatin.  Past Medical History:  Diagnosis Date  . C. difficile colitis    post op from cipro  . Hyperlipidemia   . Meningioma ([place])   . Normal stress echocardiogram 09/2005   [facility]  . Staph infection    post op    Past Surgical History:  Procedure Laterality Date  . ATRIAL ABLATION SURGERY     for AVNRT at [place]  . BREAST SURGERY     breast implants- silicone  . VENTRAL HERNIA REPAIR  10/2010   Dr. Franz Dell    Family History  Problem Relation Age of Onset  . Hypertension Mother     Social History   Socioeconomic History  . Marital status: Married    Spouse name: Not on file  . Number of children: Not on file  .  Years of education: Not on file  . Highest education level: Not on file  Occupational History  . Not on file  Social Needs  . Financial resource strain: Not on file  . Food insecurity:    Worry: Not on file    Inability: Not on file  . Transportation needs:    Medical: Not on file    Non-medical: Not on file  Tobacco Use  . Smoking status: Never Smoker  . Smokeless tobacco: Never Used  Substance and Sexual Activity  . Alcohol use: No  . Drug use: No  . Sexual activity: Not on file  Lifestyle  . Physical activity:    Days per week: Not on file    Minutes per session: Not on file  . Stress: Not on file  Relationships  . Social connections:    Talks on phone: Not on file    Gets together: Not on file    Attends religious service: Not on file    Active member of club or organization: Not on file    Attends meetings of clubs or organizations: Not on file    Relationship status: Not on file  .  Intimate partner violence:    Fear of current or ex partner: Not on file    Emotionally abused: Not on file    Physically abused: Not on file    Forced sexual activity: Not on file  Other Topics Concern  . Not on file  Social History Narrative  . Not on file    Outpatient Medications Prior to Visit  Medication Sig Dispense Refill  . Biotin 5000 MCG CAPS Take by mouth.    . Multiple Vitamins-Minerals (MULTIVITAMIN ADULT PO) Take by mouth.    . topiramate (TOPAMAX) 50 MG tablet TAKE 1 TABLET TWICE DAILY 180 tablet 0  . AMBULATORY NON FORMULARY MEDICATION Medication Name: Shingrix IM x 1. Then repeat in 2-6 months. 1 vial 0  . AMBULATORY NON FORMULARY MEDICATION Medication Name: tdap IM x 1 1 vial 0  . vitamin B-12 (CYANOCOBALAMIN) 500 MCG tablet Take by mouth.    . simvastatin (ZOCOR) 20 MG tablet Take 1 tablet (20 mg total) by mouth at bedtime. (Patient not taking: Reported on 10/22/2018) 90 tablet 3   No facility-administered medications prior to visit.     Allergies  Allergen  Reactions  . Simvastatin Other (See Comments)    Headache  . Atorvastatin Other (See Comments)    Myalgias    ROS     Objective:    Physical Exam  Constitutional: She is oriented to person, place, and time. She appears well-developed and well-nourished.  HENT:  Head: Normocephalic and atraumatic.  Right Ear: External ear normal.  Left Ear: External ear normal.  Nose: Nose normal.  Mouth/Throat: Oropharynx is clear and moist.  TMs and canals are clear.   Eyes: Pupils are equal, round, and reactive to light. Conjunctivae and EOM are normal.  Neck: Neck supple. No thyromegaly present.  Cardiovascular: Normal rate, regular rhythm and normal heart sounds.  Pulmonary/Chest: Effort normal and breath sounds normal. She has no wheezes.  Lymphadenopathy:    She has no cervical adenopathy.  Neurological: She is alert and oriented to person, place, and time.  Skin: Skin is warm and dry.  Psychiatric: She has a normal mood and affect.    BP 127/76   Pulse 85   Temp 97.7 F (36.5 C) (Oral)   Ht 5\' 3"  (1.6 m)   Wt 140 lb (63.5 kg)   SpO2 97%   BMI 24.80 kg/m  Wt Readings from Last 3 Encounters:  10/22/18 140 lb (63.5 kg)  11/13/17 141 lb (64 kg)  05/16/17 138 lb (62.6 kg)    There are no preventive care reminders to display for this patient.  There are no preventive care reminders to display for this patient.   Lab Results  Component Value Date   TSH 1.59 11/14/2015   Lab Results  Component Value Date   WBC 7.1 04/19/2017   HGB 14.0 04/19/2017   HCT 42.5 04/19/2017   MCV 91.4 04/19/2017   PLT 414 (H) 04/19/2017   Lab Results  Component Value Date   NA 142 11/13/2017   K 4.4 11/13/2017   CO2 25 11/13/2017   GLUCOSE 90 11/13/2017   BUN 15 11/13/2017   CREATININE 0.94 (H) 11/13/2017   BILITOT 0.6 11/13/2017   ALKPHOS 67 12/12/2016   AST 19 11/13/2017   ALT 17 11/13/2017   PROT 6.7 11/13/2017   ALBUMIN 3.9 12/12/2016   CALCIUM 9.4 11/13/2017   Lab Results   Component Value Date   CHOL 198 11/13/2017   Lab Results  Component  Value Date   HDL 62 11/13/2017   Lab Results  Component Value Date   LDLCALC 116 (H) 11/13/2017   Lab Results  Component Value Date   TRIG 102 11/13/2017   Lab Results  Component Value Date   CHOLHDL 3.2 11/13/2017   Lab Results  Component Value Date   HGBA1C 5.6 11/13/2017       Assessment & Plan:   Problem List Items Addressed This Visit    None    Visit Diagnoses    Acute non-recurrent frontal sinusitis    -  Primary   Medication side effect, initial encounter       Nonintractable episodic headache, unspecified headache type         Acute sinusitis-likely viral.  If not improving then please give [place] a call back or if he develops fever or suddenly gets worse and please call sooner.  In the meantime recommend symptomatic care.  Okay to use over-the-counter Sudafed as long as it does not cause palpitations or heart racing.  Recommend nasal saline rinse and can use ibuprofen or Tylenol for pain relief.  Did encourage her to actually check her temperature this evening.  Medication side effect-we will add simvastatin to her intolerance list but discussed that we would need to look at putting her on a different statin.  She says she has an appointment with me in a couple weeks and says she would rather wait to discuss starting something different at that time.  Headaches-she feels like this is where the previous surgical excision was.  Encouraged her to get back in with 1 of the surgeons partners since her surgeon that did it has left.  When asked if I can go through her chart and figure out exactly what type of procedure she had done as again I do not have this listed on her surgical history.  No orders of the defined types were placed in this encounter.    Beatrice Lecher, MD

## 2018-10-22 NOTE — Patient Instructions (Signed)
OK to try Sudafed.  Ask your pharmacist you will have to show your license.  Try the nasal saline rinse.  Can use Ibuprofen or tylenol for pain.   Check you temp tonight.

## 2018-10-30 DIAGNOSIS — H5213 Myopia, bilateral: Secondary | ICD-10-CM | POA: Diagnosis not present

## 2018-10-30 DIAGNOSIS — H43813 Vitreous degeneration, bilateral: Secondary | ICD-10-CM | POA: Diagnosis not present

## 2018-11-13 ENCOUNTER — Ambulatory Visit (INDEPENDENT_AMBULATORY_CARE_PROVIDER_SITE_OTHER): Payer: Medicare Other | Admitting: Family Medicine

## 2018-11-13 ENCOUNTER — Encounter: Payer: Self-pay | Admitting: Family Medicine

## 2018-11-13 VITALS — BP 122/75 | HR 73 | Ht 63.0 in | Wt 137.0 lb

## 2018-11-13 DIAGNOSIS — E7800 Pure hypercholesterolemia, unspecified: Secondary | ICD-10-CM | POA: Diagnosis not present

## 2018-11-13 DIAGNOSIS — G43009 Migraine without aura, not intractable, without status migrainosus: Secondary | ICD-10-CM

## 2018-11-13 DIAGNOSIS — I471 Supraventricular tachycardia: Secondary | ICD-10-CM | POA: Diagnosis not present

## 2018-11-13 DIAGNOSIS — R7301 Impaired fasting glucose: Secondary | ICD-10-CM | POA: Diagnosis not present

## 2018-11-13 LAB — POCT GLYCOSYLATED HEMOGLOBIN (HGB A1C): HEMOGLOBIN A1C: 5.5 % (ref 4.0–5.6)

## 2018-11-13 MED ORDER — AMBULATORY NON FORMULARY MEDICATION: 0 refills | Status: DC

## 2018-11-13 MED ORDER — TOPAMAX 50 MG PO TABS: 50.0000 mg | ORAL_TABLET | Freq: Two times a day (BID) | ORAL | 3 refills | Status: DC

## 2018-11-13 NOTE — Progress Notes (Signed)
Subjective:    CC:   HPI:  Impaired fasting glucose-no increased thirst or urination. No symptoms consistent with hypoglycemia.  Hyperipidemia-last LDL was 116.  Not currently on medication or treatment.  Due to recheck levels.  Migraine headaches-she still getting some occasional headaches she feels like there is a big difference between the branded Topamax and the generic.  She was asking about good Rx and may be trying to find a cheaper price for the brand.  She said when she checked with her pharmacy it was going to cost the thousand dollars.  Hx of PSVT -she has not seen cardiology in a couple of years and would like to get back in with him just to have everything checked and make sure that everything looks okay.  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  I see several abdominal examinations but nothing thoracic.   Past medical history, Surgical history, Family history not pertinant except as noted below, Social history, Allergies, and medications have been entered into the medical record, reviewed, and corrections made.   Review of Systems: No fevers, chills, night sweats, weight loss, chest pain, or shortness of breath.   Objective:    General: Well Developed, well nourished, and in no acute distress.  Neuro: Alert and oriented x3, extra-ocular muscles intact, sensation grossly intact.  HEENT: Normocephalic, atraumatic    Skin: Warm and dry, no rashes. Cardiac: Regular rate and rhythm, no murmurs rubs or gallops, no lower extremity edema.  Respiratory: Clear to auscultation bilaterally. Not using accessory muscles, speaking in full sentences.   Impression and Recommendations:    IFG - A1C of 5.5 today.  Looks fantastic.  Continue to monitor yearly.  Due for screening labs.  Hyperlipidemia-due to recheck lipid panel.  Migraine -prescription given for branded Topamax so she  can check on good Rx and see where she might be able to find it cheaper if not she will be due for refill in a few weeks and she can always call me back if she needs the generic sent.  History of PSVT-recommend referral back to cardiology for just checkup if everything is good and they want to release her than that is perfectly fine.  Wants to get her 2nd Shingrix vaccine.  Will have to get at the pharmacy.

## 2018-11-14 LAB — LIPID PANEL W/REFLEX DIRECT LDL
Cholesterol: 270 mg/dL — ABNORMAL HIGH (ref <200)
HDL: 59 mg/dL (ref >=50)
LDL Cholesterol (Calc): 185 mg/dL (calc) — ABNORMAL HIGH
NON-HDL CHOLESTEROL (CALC): 211 mg/dL — AB (ref <130)
Total CHOL/HDL Ratio: 4.6 (calc) (ref <5.0)
Triglycerides: 126 mg/dL (ref <150)

## 2018-11-14 LAB — COMPLETE METABOLIC PANEL WITH GFR
AG RATIO: 1.9 (calc) (ref 1.0–2.5)
ALT: 21 U/L (ref 6–29)
AST: 22 U/L (ref 10–35)
Albumin: 4.4 g/dL (ref 3.6–5.1)
Alkaline phosphatase (APISO): 64 U/L (ref 37–153)
BUN / CREAT RATIO: 24 (calc) — AB (ref 6–22)
BUN: 23 mg/dL (ref 7–25)
CALCIUM: 9.8 mg/dL (ref 8.6–10.4)
CO2: 22 mmol/L (ref 20–32)
Chloride: 110 mmol/L (ref 98–110)
Creat: 0.96 mg/dL — ABNORMAL HIGH (ref 0.60–0.93)
GFR, EST NON AFRICAN AMERICAN: 59 mL/min/{1.73_m2} — AB (ref >=60)
GFR, Est African American: 68 mL/min/{1.73_m2} (ref >=60)
GLOBULIN: 2.3 g/dL (ref 1.9–3.7)
Glucose, Bld: 89 mg/dL (ref 65–99)
POTASSIUM: 4.9 mmol/L (ref 3.5–5.3)
SODIUM: 143 mmol/L (ref 135–146)
Total Bilirubin: 0.6 mg/dL (ref 0.2–1.2)
Total Protein: 6.7 g/dL (ref 6.1–8.1)

## 2018-11-14 MED ORDER — PRAVASTATIN SODIUM 10 MG PO TABS: 10.0000 mg | ORAL_TABLET | Freq: Every day | ORAL | 3 refills | Status: DC

## 2018-11-14 NOTE — Addendum Note (Signed)
Addended by: Beatrice Lecher D on: 11/14/2018 10:48 AM   Modules accepted: Orders

## 2019-02-18 DIAGNOSIS — R55 Syncope and collapse: Secondary | ICD-10-CM | POA: Diagnosis not present

## 2019-02-18 DIAGNOSIS — R0789 Other chest pain: Secondary | ICD-10-CM | POA: Diagnosis not present

## 2019-02-18 DIAGNOSIS — R000 Tachycardia, unspecified: Secondary | ICD-10-CM | POA: Diagnosis not present

## 2019-02-18 DIAGNOSIS — E785 Hyperlipidemia, unspecified: Secondary | ICD-10-CM | POA: Diagnosis not present

## 2019-02-18 DIAGNOSIS — R002 Palpitations: Secondary | ICD-10-CM | POA: Diagnosis not present

## 2019-02-20 DIAGNOSIS — R002 Palpitations: Secondary | ICD-10-CM | POA: Diagnosis not present

## 2019-02-26 DIAGNOSIS — R079 Chest pain, unspecified: Secondary | ICD-10-CM | POA: Diagnosis not present

## 2019-02-26 DIAGNOSIS — R0789 Other chest pain: Secondary | ICD-10-CM | POA: Diagnosis not present

## 2019-02-26 DIAGNOSIS — E785 Hyperlipidemia, unspecified: Secondary | ICD-10-CM | POA: Diagnosis not present

## 2019-02-26 DIAGNOSIS — R000 Tachycardia, unspecified: Secondary | ICD-10-CM | POA: Diagnosis not present

## 2019-03-10 DIAGNOSIS — I471 Supraventricular tachycardia: Secondary | ICD-10-CM | POA: Diagnosis not present

## 2019-03-10 DIAGNOSIS — R002 Palpitations: Secondary | ICD-10-CM | POA: Diagnosis not present

## 2019-03-24 DIAGNOSIS — Z1231 Encounter for screening mammogram for malignant neoplasm of breast: Secondary | ICD-10-CM | POA: Diagnosis not present

## 2019-03-24 LAB — HM MAMMOGRAPHY

## 2019-04-01 DIAGNOSIS — R1011 Right upper quadrant pain: Secondary | ICD-10-CM | POA: Diagnosis not present

## 2019-04-03 ENCOUNTER — Encounter: Payer: Self-pay | Admitting: Family Medicine

## 2019-06-12 DIAGNOSIS — Z23 Encounter for immunization: Secondary | ICD-10-CM | POA: Diagnosis not present

## 2019-06-12 DIAGNOSIS — N819 Female genital prolapse, unspecified: Secondary | ICD-10-CM | POA: Diagnosis not present

## 2019-06-12 DIAGNOSIS — R1031 Right lower quadrant pain: Secondary | ICD-10-CM | POA: Diagnosis not present

## 2019-07-07 DIAGNOSIS — N83201 Unspecified ovarian cyst, right side: Secondary | ICD-10-CM | POA: Diagnosis not present

## 2019-07-07 DIAGNOSIS — Z01419 Encounter for gynecological examination (general) (routine) without abnormal findings: Secondary | ICD-10-CM | POA: Diagnosis not present

## 2019-07-07 DIAGNOSIS — R102 Pelvic and perineal pain: Secondary | ICD-10-CM | POA: Diagnosis not present

## 2019-08-04 ENCOUNTER — Other Ambulatory Visit: Payer: Self-pay

## 2019-08-14 DIAGNOSIS — N8111 Cystocele, midline: Secondary | ICD-10-CM | POA: Diagnosis not present

## 2019-08-14 DIAGNOSIS — N83201 Unspecified ovarian cyst, right side: Secondary | ICD-10-CM | POA: Diagnosis not present

## 2019-08-14 DIAGNOSIS — N812 Incomplete uterovaginal prolapse: Secondary | ICD-10-CM | POA: Diagnosis not present

## 2019-08-17 DIAGNOSIS — R1011 Right upper quadrant pain: Secondary | ICD-10-CM | POA: Diagnosis not present

## 2019-08-17 DIAGNOSIS — K649 Unspecified hemorrhoids: Secondary | ICD-10-CM | POA: Diagnosis not present

## 2019-08-19 DIAGNOSIS — Z780 Asymptomatic menopausal state: Secondary | ICD-10-CM | POA: Diagnosis not present

## 2019-09-02 ENCOUNTER — Telehealth: Payer: Self-pay | Admitting: Family Medicine

## 2019-09-02 NOTE — Telephone Encounter (Signed)
No ma'am I do not have any clue as to what she is referencing. Sorry.Maryruth Eve, Lahoma Crocker, CMA

## 2019-09-02 NOTE — Telephone Encounter (Signed)
Called patient and she is aware that she is up to date on all of her immunizations. She wanting a Covid-19 shot once they are available to the public. No other questions at this time.

## 2019-09-02 NOTE — Telephone Encounter (Signed)
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 I do not see anything that has been previously had a shot done but she does not know the name of the shot. Do you know anything about this?

## 2019-09-12 DIAGNOSIS — Z01818 Encounter for other preprocedural examination: Secondary | ICD-10-CM | POA: Diagnosis not present

## 2019-09-15 DIAGNOSIS — N800 Endometriosis of uterus: Secondary | ICD-10-CM | POA: Diagnosis not present

## 2019-09-15 DIAGNOSIS — Z79899 Other long term (current) drug therapy: Secondary | ICD-10-CM | POA: Diagnosis not present

## 2019-09-15 DIAGNOSIS — G43909 Migraine, unspecified, not intractable, without status migrainosus: Secondary | ICD-10-CM | POA: Diagnosis not present

## 2019-09-15 DIAGNOSIS — N812 Incomplete uterovaginal prolapse: Secondary | ICD-10-CM | POA: Diagnosis not present

## 2019-09-15 DIAGNOSIS — N840 Polyp of corpus uteri: Secondary | ICD-10-CM | POA: Diagnosis not present

## 2019-09-15 DIAGNOSIS — D250 Submucous leiomyoma of uterus: Secondary | ICD-10-CM | POA: Diagnosis not present

## 2019-09-15 DIAGNOSIS — E785 Hyperlipidemia, unspecified: Secondary | ICD-10-CM | POA: Diagnosis not present

## 2019-09-15 DIAGNOSIS — N83291 Other ovarian cyst, right side: Secondary | ICD-10-CM | POA: Diagnosis not present

## 2019-09-15 DIAGNOSIS — N83201 Unspecified ovarian cyst, right side: Secondary | ICD-10-CM | POA: Diagnosis not present

## 2019-09-15 DIAGNOSIS — K279 Peptic ulcer, site unspecified, unspecified as acute or chronic, without hemorrhage or perforation: Secondary | ICD-10-CM | POA: Diagnosis not present

## 2019-09-16 DIAGNOSIS — N83201 Unspecified ovarian cyst, right side: Secondary | ICD-10-CM | POA: Diagnosis not present

## 2019-09-16 DIAGNOSIS — N812 Incomplete uterovaginal prolapse: Secondary | ICD-10-CM | POA: Diagnosis not present

## 2019-09-24 ENCOUNTER — Telehealth: Payer: Self-pay

## 2019-09-24 NOTE — Telephone Encounter (Signed)
Thank you for reaching out to our office.  If you are in Phase 1b, age 75 and over, you can contact the [facility] at [phone] to schedule an appointment for your vaccine.  Phone lines will be open 8 AM to 6:30 PM Monday through Friday, and 8 AM to 1 PM on Saturdays and Sundays.  They will start the vaccine clinic 09/16/2019 per the [facility] web site. If you do not live in [place] I would recommend you go to your county health department web site to find out how to register for the vaccine and where to go.  

## 2019-10-27 DIAGNOSIS — Z23 Encounter for immunization: Secondary | ICD-10-CM | POA: Diagnosis not present

## 2019-11-02 DIAGNOSIS — Z130 Encounter for screening for diseases of the blood and blood-forming organs and certain disorders involving the immune mechanism: Secondary | ICD-10-CM | POA: Diagnosis not present

## 2019-11-02 DIAGNOSIS — Z09 Encounter for follow-up examination after completed treatment for conditions other than malignant neoplasm: Secondary | ICD-10-CM | POA: Diagnosis not present

## 2019-11-03 DIAGNOSIS — R69 Illness, unspecified: Secondary | ICD-10-CM | POA: Diagnosis not present

## 2019-11-08 ENCOUNTER — Other Ambulatory Visit: Payer: Self-pay | Admitting: Family Medicine

## 2019-11-09 NOTE — Telephone Encounter (Signed)
Needs appointment

## 2019-11-12 ENCOUNTER — Other Ambulatory Visit: Payer: Self-pay | Admitting: Family Medicine

## 2019-12-05 ENCOUNTER — Other Ambulatory Visit: Payer: Self-pay | Admitting: Family Medicine

## 2019-12-10 DIAGNOSIS — E559 Vitamin D deficiency, unspecified: Secondary | ICD-10-CM | POA: Diagnosis not present

## 2019-12-10 DIAGNOSIS — Z79899 Other long term (current) drug therapy: Secondary | ICD-10-CM | POA: Diagnosis not present

## 2019-12-10 DIAGNOSIS — M810 Age-related osteoporosis without current pathological fracture: Secondary | ICD-10-CM | POA: Diagnosis not present

## 2019-12-15 ENCOUNTER — Ambulatory Visit (INDEPENDENT_AMBULATORY_CARE_PROVIDER_SITE_OTHER): Payer: Medicare HMO | Admitting: Family Medicine

## 2019-12-15 ENCOUNTER — Other Ambulatory Visit: Payer: Self-pay

## 2019-12-15 ENCOUNTER — Encounter: Payer: Self-pay | Admitting: Family Medicine

## 2019-12-15 VITALS — BP 119/68 | HR 71 | Temp 98.5°F | Wt 138.0 lb

## 2019-12-15 DIAGNOSIS — M545 Low back pain, unspecified: Secondary | ICD-10-CM

## 2019-12-15 DIAGNOSIS — M810 Age-related osteoporosis without current pathological fracture: Secondary | ICD-10-CM | POA: Diagnosis not present

## 2019-12-15 DIAGNOSIS — E7800 Pure hypercholesterolemia, unspecified: Secondary | ICD-10-CM

## 2019-12-15 DIAGNOSIS — R7301 Impaired fasting glucose: Secondary | ICD-10-CM

## 2019-12-15 DIAGNOSIS — G43009 Migraine without aura, not intractable, without status migrainosus: Secondary | ICD-10-CM

## 2019-12-15 DIAGNOSIS — M8580 Other specified disorders of bone density and structure, unspecified site: Secondary | ICD-10-CM

## 2019-12-15 MED ORDER — ALENDRONATE SODIUM 70 MG PO TABS: 70.0000 mg | ORAL_TABLET | ORAL | 3 refills | Status: DC

## 2019-12-15 NOTE — Patient Instructions (Signed)
Courage you to work on your walking and stretching over the next few weeks and if your back is not improving then please let me know.  We can also consider formal referral to physical therapy to work on your back if needed.  Thin bones recommend a daily and calcium with vitamin D supplement.  Examples include Caltrate D and Viactiv chews.  Also put you on generic Fosamax as a bone builder to see if this is helping.  If you do not tolerate it well please let us know.

## 2019-12-15 NOTE — Assessment & Plan Note (Signed)
Doing well on Topamax. Continue current regimen.  Headaches well controlled currently.

## 2019-12-15 NOTE — Assessment & Plan Note (Signed)
She reported they had discussed possibly starting a bisphosphonate and wanted to know if I could write a prescription for her.  We discussed bisphosphonates versus Prolia.  She would like to try bisphosphonate first.  Prescription for Fosamax sent to pharmacy.  Encouraged her to make sure she is taking adequate calcium with vitamin D.

## 2019-12-15 NOTE — Assessment & Plan Note (Signed)
We will add up-to-date A1c today. Lab Results  Component Value Date   HGBA1C 5.5 11/13/2018

## 2019-12-15 NOTE — Progress Notes (Signed)
Established Patient Office Visit  Subjective:  Patient ID: Virginia Williams, female    DOB: [birth year]  Age: 74 y.o. MRN: [id number]  CC: No chief complaint on file.   HPI Virginia Williams presents for   Follow-up of migraine headaches-overall she feels like she is doing okay she wants to stay on the Topamax 50 mg twice a day.  She actually had a meningioma removed off of her eye area and ended up having 4 surgeries.  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  Ever since then she has some occasional headaches right over that area occasionally will have some sharp pain and discomfort when lying on that side at night.  So that is why she would like to stay on the Topamax.  Dr. Berniece Andreas did her last 2 surgeries for the meningioma.  Hyperlipidemia-tolerating pravastatin well without any side effects or problems.  She is due for lipid check.  She also wanted let me know that she did have her Covid vaccination series.  She also reported that she did have a complete hysterectomy back in January.  Since then she just has not quite felt like herself she is been more constipated she is felt more emotional and just felt very fatigued.  She is also had some new onset bilateral low back pain which she was not having previously.  He was also recently diagnosed with osteoporosis and they had discussed starting her on medication for it.  Past Medical History:  Diagnosis Date  . C. difficile colitis    post op from cipro  . Hyperlipidemia   . Meningioma ([place])   . Normal stress echocardiogram 09/2005   [facility]  . Staph infection    post op    Past Surgical History:  Procedure Laterality Date  . ATRIAL ABLATION SURGERY     for AVNRT at [place]  . BREAST SURGERY     breast implants- silicone  . VENTRAL HERNIA REPAIR  10/2010   Dr. Franz Dell    Family History  Problem Relation Age of Onset  . Hypertension Mother     Social History   Socioeconomic History  . Marital  status: Married    Spouse name: Not on file  . Number of children: Not on file  . Years of education: Not on file  . Highest education level: Not on file  Occupational History  . Not on file  Tobacco Use  . Smoking status: Never Smoker  . Smokeless tobacco: Never Used  Substance and Sexual Activity  . Alcohol use: No  . Drug use: No  . Sexual activity: Not on file  Other Topics Concern  . Not on file  Social History Narrative  . Not on file   Social Determinants of Health   Financial Resource Strain:   . Difficulty of Paying Living Expenses:   Food Insecurity:   . Worried About Charity fundraiser in the Last Year:   . Arboriculturist in the Last Year:   Transportation Needs:   . Film/video editor (Medical):   Marland Kitchen Lack of Transportation (Non-Medical):   Physical Activity:   . Days of Exercise per Week:   . Minutes of Exercise per Session:   Stress:   . Feeling of Stress :   Social Connections:   . Frequency of Communication with Friends and Family:   . Frequency of Social Gatherings with Friends and Family:   . Attends Religious Services:   .  Active Member of Clubs or Organizations:   . Attends Archivist Meetings:   Marland Kitchen Marital Status:   Intimate Partner Violence:   . Fear of Current or Ex-Partner:   . Emotionally Abused:   Marland Kitchen Physically Abused:   . Sexually Abused:     Outpatient Medications Prior to Visit  Medication Sig Dispense Refill  . AMBULATORY NON FORMULARY MEDICATION Medication Name: Shingrix IM x 1. Then repeat in 2-6 months. 1 vial 0  . Biotin 5000 MCG CAPS Take by mouth.    . Multiple Vitamins-Minerals (MULTIVITAMIN ADULT PO) Take by mouth.    . pravastatin (PRAVACHOL) 10 MG tablet Take 1 tablet (10 mg total) by mouth daily. PLEASE MAKE APPOINTMENT 30 tablet 0  . TOPAMAX 50 MG tablet Take 1 tablet (50 mg total) by mouth 2 (two) times daily. 180 tablet 3  . topiramate (TOPAMAX) 50 MG tablet TAKE ONE TABLET BY MOUTH TWICE A DAY 180 tablet 2    No facility-administered medications prior to visit.    Allergies  Allergen Reactions  . Simvastatin Other (See Comments)    Headache  . Atorvastatin Other (See Comments)    Myalgias    ROS Review of Systems    Objective:    Physical Exam  Constitutional: She is oriented to person, place, and time. She appears well-developed and well-nourished.  HENT:  Head: Normocephalic and atraumatic.  Cardiovascular: Normal rate, regular rhythm and normal heart sounds.  Pulmonary/Chest: Effort normal and breath sounds normal.  Neurological: She is alert and oriented to person, place, and time.  Skin: Skin is warm and dry.  Psychiatric: She has a normal mood and affect. Her behavior is normal.    BP 119/68 (BP Location: Left Arm, Patient Position: Sitting, Cuff Size: Normal)   Pulse 71   Temp 98.5 F (36.9 C) (Oral)   Wt 138 lb (62.6 kg)   SpO2 98%   BMI 24.45 kg/m  Wt Readings from Last 3 Encounters:  12/15/19 138 lb (62.6 kg)  11/13/18 137 lb (62.1 kg)  10/22/18 140 lb (63.5 kg)     There are no preventive care reminders to display for this patient.  There are no preventive care reminders to display for this patient.  Lab Results  Component Value Date   TSH 1.59 11/14/2015   Lab Results  Component Value Date   WBC 7.1 04/19/2017   HGB 14.0 04/19/2017   HCT 42.5 04/19/2017   MCV 91.4 04/19/2017   PLT 414 (H) 04/19/2017   Lab Results  Component Value Date   NA 143 11/13/2018   K 4.9 11/13/2018   CO2 22 11/13/2018   GLUCOSE 89 11/13/2018   BUN 23 11/13/2018   CREATININE 0.96 (H) 11/13/2018   BILITOT 0.6 11/13/2018   ALKPHOS 67 12/12/2016   AST 22 11/13/2018   ALT 21 11/13/2018   PROT 6.7 11/13/2018   ALBUMIN 3.9 12/12/2016   CALCIUM 9.8 11/13/2018   Lab Results  Component Value Date   CHOL 270 (H) 11/13/2018   Lab Results  Component Value Date   HDL 59 11/13/2018   Lab Results  Component Value Date   LDLCALC 185 (H) 11/13/2018   Lab Results   Component Value Date   TRIG 126 11/13/2018   Lab Results  Component Value Date   CHOLHDL 4.6 11/13/2018   Lab Results  Component Value Date   HGBA1C 5.5 11/13/2018      Assessment & Plan:   Problem List Items Addressed This  Visit      Cardiovascular and Mediastinum   Migraine headache    Doing well on Topamax. Continue current regimen.  Headaches well controlled currently.        Endocrine   IFG (impaired fasting glucose) - Primary    We will add up-to-date A1c today. Lab Results  Component Value Date   HGBA1C 5.5 11/13/2018         Relevant Orders   Lipid Panel With LDL/HDL Ratio   Comprehensive metabolic panel   HgB 123456   COMPLETE METABOLIC PANEL WITH GFR   Lipid Panel w/reflex Direct LDL     Musculoskeletal and Integument   Osteopenia    She reported they had discussed possibly starting a bisphosphonate and wanted to know if I could write a prescription for her.  We discussed bisphosphonates versus Prolia.  She would like to try bisphosphonate first.  Prescription for Fosamax sent to pharmacy.  Encouraged her to make sure she is taking adequate calcium with vitamin D.        Other   HYPERCHOLESTEROLEMIA   Relevant Orders   Lipid Panel With LDL/HDL Ratio   COMPLETE METABOLIC PANEL WITH GFR   Lipid Panel w/reflex Direct LDL    Other Visit Diagnoses    Age-related osteoporosis without current pathological fracture       Relevant Medications   alendronate (FOSAMAX) 70 MG tablet   Acute midline low back pain without sciatica          Status post hysterectomy-she just has not quite felt like her self since then she has been more tired and more emotional.  She does have a follow-up with her GYN soon.  It really should not be a hormone issue as she is already been postmenopausal for 20 years but certainly having surgery can be a little bit stressful she is also noticed that she is had some constipation since then.  We will check her thyroid but also could be  scar tissue so did encourage her to mention that to her surgeon as well.  Low back pain-started after her hysterectomy as well.  It sounds like it is more musculoskeletal mechanical.  Discussed working on gentle stretches and try to get back into her regular exercise routine including walking and if it still persisting over the next few weeks and I will be happy to see her back and we can do further work-up.  She also has had a little bit more constipation since the surgery and that could be contributing to her low back pain as well.   Meds ordered this encounter  Medications  . alendronate (FOSAMAX) 70 MG tablet    Sig: Take 1 tablet (70 mg total) by mouth once a week. Take with a full glass of water on an empty stomach.    Dispense:  12 tablet    Refill:  3    Follow-up: Return if symptoms worsen or fail to improve.    Beatrice Lecher, MD

## 2019-12-17 DIAGNOSIS — R7301 Impaired fasting glucose: Secondary | ICD-10-CM | POA: Diagnosis not present

## 2019-12-17 DIAGNOSIS — E7800 Pure hypercholesterolemia, unspecified: Secondary | ICD-10-CM | POA: Diagnosis not present

## 2019-12-18 LAB — LIPID PANEL W/REFLEX DIRECT LDL
Cholesterol: 207 mg/dL — ABNORMAL HIGH (ref <200)
HDL: 61 mg/dL (ref >=50)
LDL Cholesterol (Calc): 125 mg/dL (calc) — ABNORMAL HIGH
Non-HDL Cholesterol (Calc): 146 mg/dL (calc) — ABNORMAL HIGH (ref <130)
Total CHOL/HDL Ratio: 3.4 (calc) (ref <5.0)
Triglycerides: 106 mg/dL (ref <150)

## 2019-12-18 LAB — COMPLETE METABOLIC PANEL WITH GFR
AG Ratio: 1.8 (calc) (ref 1.0–2.5)
ALT: 17 U/L (ref 6–29)
AST: 19 U/L (ref 10–35)
Albumin: 4.2 g/dL (ref 3.6–5.1)
Alkaline phosphatase (APISO): 72 U/L (ref 37–153)
BUN: 17 mg/dL (ref 7–25)
CO2: 23 mmol/L (ref 20–32)
Calcium: 9.5 mg/dL (ref 8.6–10.4)
Chloride: 112 mmol/L — ABNORMAL HIGH (ref 98–110)
Creat: 0.86 mg/dL (ref 0.60–0.93)
GFR, Est African American: 77 mL/min/{1.73_m2} (ref >=60)
GFR, Est Non African American: 67 mL/min/{1.73_m2} (ref >=60)
Globulin: 2.4 g/dL (calc) (ref 1.9–3.7)
Glucose, Bld: 90 mg/dL (ref 65–99)
Potassium: 4.1 mmol/L (ref 3.5–5.3)
Sodium: 141 mmol/L (ref 135–146)
Total Bilirubin: 0.6 mg/dL (ref 0.2–1.2)
Total Protein: 6.6 g/dL (ref 6.1–8.1)

## 2019-12-18 LAB — HEMOGLOBIN A1C
Hgb A1c MFr Bld: 5.3 % of total Hgb (ref <5.7)
Mean Plasma Glucose: 105 (calc)
eAG (mmol/L): 5.8 (calc)

## 2019-12-21 ENCOUNTER — Telehealth: Payer: Self-pay

## 2019-12-21 NOTE — Telephone Encounter (Signed)
Meridith called about lab results. Please advise.

## 2019-12-21 NOTE — Telephone Encounter (Signed)
Labs just resulted.  Please let her know that I was out of town and just returned today.

## 2019-12-22 NOTE — Telephone Encounter (Signed)
See result note.  

## 2019-12-23 ENCOUNTER — Telehealth: Payer: Self-pay

## 2019-12-23 NOTE — Telephone Encounter (Signed)
Kitrina wanted to know if Dr Madilyn Fireman has ordered the head imaging. Please advise.

## 2019-12-23 NOTE — Telephone Encounter (Signed)
Virginia Williams states she had a procedure in 2010. She did have a MRI of the brain in 2011. She sometimes have left sided headaches. She has been scheduled for a visit with Dr Madilyn Fireman to evaluate the need for further imaging.

## 2019-12-23 NOTE — Telephone Encounter (Signed)
I am sorry, I am confused I am not sure what she is talking about.  I reviewed my note as well as the blood work that we did and do not remember recommending any type of head imaging.  Please clarify which with patient.  Possible I may have omitted something from my note.

## 2019-12-29 ENCOUNTER — Ambulatory Visit: Payer: Medicare HMO | Admitting: Family Medicine

## 2020-01-15 ENCOUNTER — Other Ambulatory Visit: Payer: Self-pay | Admitting: Family Medicine

## 2020-01-25 DIAGNOSIS — N952 Postmenopausal atrophic vaginitis: Secondary | ICD-10-CM | POA: Diagnosis not present

## 2020-02-01 DIAGNOSIS — H5213 Myopia, bilateral: Secondary | ICD-10-CM | POA: Diagnosis not present

## 2020-02-01 DIAGNOSIS — H2513 Age-related nuclear cataract, bilateral: Secondary | ICD-10-CM | POA: Diagnosis not present

## 2020-02-01 DIAGNOSIS — H524 Presbyopia: Secondary | ICD-10-CM | POA: Diagnosis not present

## 2020-02-01 DIAGNOSIS — H43813 Vitreous degeneration, bilateral: Secondary | ICD-10-CM | POA: Diagnosis not present

## 2020-02-03 DIAGNOSIS — H527 Unspecified disorder of refraction: Secondary | ICD-10-CM | POA: Diagnosis not present

## 2020-02-15 ENCOUNTER — Other Ambulatory Visit: Payer: Self-pay | Admitting: Family Medicine

## 2020-04-04 DIAGNOSIS — Z1231 Encounter for screening mammogram for malignant neoplasm of breast: Secondary | ICD-10-CM | POA: Diagnosis not present

## 2020-04-04 LAB — HM MAMMOGRAPHY

## 2020-04-14 ENCOUNTER — Encounter: Payer: Self-pay | Admitting: Family Medicine

## 2020-05-17 ENCOUNTER — Telehealth: Payer: Self-pay

## 2020-05-17 NOTE — Telephone Encounter (Signed)
Due for meningitis vaccine, and flu.  Not due for pneumo until next year. Doesn't need HIB.

## 2020-05-17 NOTE — Telephone Encounter (Signed)
Left pt msg advising of what she is due for and advised her we can catch up at appt in October

## 2020-05-17 NOTE — Telephone Encounter (Signed)
Pt called requesting whether she was due to update her immunization. Per pt, has a history of splenectomy, unsure if she is due for Pneumo, HIB and meningococcal vaccines. She has an upcoming appt on 06/15/20 and would like to complete her vaccines during that visit if needed. Pls advise, thanks.

## 2020-06-15 ENCOUNTER — Encounter: Payer: Self-pay | Admitting: Family Medicine

## 2020-06-15 ENCOUNTER — Ambulatory Visit (INDEPENDENT_AMBULATORY_CARE_PROVIDER_SITE_OTHER): Payer: Medicare HMO | Admitting: Family Medicine

## 2020-06-15 VITALS — BP 129/79 | HR 73 | Temp 97.7°F | Wt 136.1 lb

## 2020-06-15 DIAGNOSIS — M8580 Other specified disorders of bone density and structure, unspecified site: Secondary | ICD-10-CM

## 2020-06-15 DIAGNOSIS — R7303 Prediabetes: Secondary | ICD-10-CM | POA: Diagnosis not present

## 2020-06-15 DIAGNOSIS — M810 Age-related osteoporosis without current pathological fracture: Secondary | ICD-10-CM

## 2020-06-15 DIAGNOSIS — R7301 Impaired fasting glucose: Secondary | ICD-10-CM

## 2020-06-15 DIAGNOSIS — Z23 Encounter for immunization: Secondary | ICD-10-CM

## 2020-06-15 DIAGNOSIS — Z9081 Acquired absence of spleen: Secondary | ICD-10-CM

## 2020-06-15 LAB — POCT GLYCOSYLATED HEMOGLOBIN (HGB A1C): Hemoglobin A1C: 5.4 % (ref 4.0–5.6)

## 2020-06-15 MED ORDER — ALENDRONATE SODIUM 70 MG PO TABS: 70.0000 mg | ORAL_TABLET | ORAL | 3 refills | Status: DC

## 2020-06-15 NOTE — Assessment & Plan Note (Signed)
Dust updating her Menveo in addition starting her meningitis B vaccine.  He agrees to both.  She just had her flu vaccine 3 weeks ago per her report.

## 2020-06-15 NOTE — Assessment & Plan Note (Signed)
Well controlled. Continue current regimen. Follow up in  6 mo  

## 2020-06-15 NOTE — Assessment & Plan Note (Signed)
Dsicussed options.  She would like to try the Fosamax I am not sure what happened to the pharmacy so I did resend the prescription today if she does not get a call within 2-3 business days encouraged her to give [place] call back and let us know if she is having any difficulty at the pharmacy.  I discussed the benefits of regular weightbearing exercise she is not currently doing any regular exercise we also discussed the importance of calcium plus vitamin D supplementation.  She does take vitamin D but not calcium currently

## 2020-06-15 NOTE — Progress Notes (Signed)
Established Patient Office Visit  Subjective:  Patient ID: Virginia Williams, female    DOB: [birth year]  Age: 74 y.o. MRN: [id number]  CC:  Chief Complaint  Patient presents with  . Follow-up  . Immunizations    meningitis     HPI Virginia Williams presents for 6 months.    Impaired fasting glucose-no increased thirst or urination. No symptoms consistent with hypoglycemia.  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    Osteoporosis-she says she never actually started the Fosamax she says she does not think the pharmacy actually received the prescription we discussed the bisphosphonate versus Prolia at her appointment 6 months ago.  Past Medical History:  Diagnosis Date  . C. difficile colitis    post op from cipro  . Hyperlipidemia   . Meningioma ([place])   . Normal stress echocardiogram 09/2005   [facility]  . Staph infection    post op    Past Surgical History:  Procedure Laterality Date  . ATRIAL ABLATION SURGERY     for AVNRT at [place]  . BREAST SURGERY     breast implants- silicone  . VENTRAL HERNIA REPAIR  10/2010   Dr. Franz Dell    Family History  Problem Relation Age of Onset  . Hypertension Mother     Social History   Socioeconomic History  . Marital status: Married    Spouse name: Not on file  . Number of children: Not on file  . Years of education: Not on file  . Highest education level: Not on file  Occupational History  . Not on file  Tobacco Use  . Smoking status: Never Smoker  . Smokeless tobacco: Never Used  Substance and Sexual Activity  . Alcohol use: No  . Drug use: No  . Sexual activity: Not on file  Other Topics Concern  . Not on file  Social History Narrative  . Not on file   Social Determinants of Health   Financial Resource Strain:   . Difficulty of Paying Living Expenses: Not on file  Food Insecurity:   . Worried About Charity fundraiser in the Last Year: Not on file  . Ran Out of Food in  the Last Year: Not on file  Transportation Needs:   . Lack of Transportation (Medical): Not on file  . Lack of Transportation (Non-Medical): Not on file  Physical Activity:   . Days of Exercise per Week: Not on file  . Minutes of Exercise per Session: Not on file  Stress:   . Feeling of Stress : Not on file  Social Connections:   . Frequency of Communication with Friends and Family: Not on file  . Frequency of Social Gatherings with Friends and Family: Not on file  . Attends Religious Services: Not on file  . Active Member of Clubs or Organizations: Not on file  . Attends Archivist Meetings: Not on file  . Marital Status: Not on file  Intimate Partner Violence:   . Fear of Current or Ex-Partner: Not on file  . Emotionally Abused: Not on file  . Physically Abused: Not on file  . Sexually Abused: Not on file    Outpatient Medications Prior to Visit  Medication Sig Dispense Refill  . Biotin 5000 MCG CAPS Take by mouth.    . Multiple Vitamins-Minerals (MULTIVITAMIN ADULT PO) Take by mouth.    . pravastatin (PRAVACHOL) 10 MG tablet Take 1 tablet (10 mg total) by  mouth daily. 90 tablet 3  . topiramate (TOPAMAX) 50 MG tablet TAKE ONE TABLET BY MOUTH TWICE A DAY 180 tablet 2  . alendronate (FOSAMAX) 70 MG tablet Take 1 tablet (70 mg total) by mouth once a week. Take with a full glass of water on an empty stomach. (Patient not taking: Reported on 06/15/2020) 12 tablet 3  . AMBULATORY NON FORMULARY MEDICATION Medication Name: Shingrix IM x 1. Then repeat in 2-6 months. (Patient not taking: Reported on 06/15/2020) 1 vial 0   No facility-administered medications prior to visit.    Allergies  Allergen Reactions  . Simvastatin Other (See Comments)    Headache  . Atorvastatin Other (See Comments)    Myalgias    ROS Review of Systems    Objective:    Physical Exam Constitutional:      Appearance: She is well-developed.  HENT:     Head: Normocephalic and atraumatic.   Cardiovascular:     Rate and Rhythm: Normal rate and regular rhythm.     Heart sounds: Normal heart sounds.  Pulmonary:     Effort: Pulmonary effort is normal.     Breath sounds: Normal breath sounds.  Musculoskeletal:     Cervical back: Neck supple.  Lymphadenopathy:     Cervical: No cervical adenopathy.  Skin:    General: Skin is warm and dry.  Neurological:     Mental Status: She is alert and oriented to person, place, and time.  Psychiatric:        Behavior: Behavior normal.     BP 129/79 (BP Location: Left Arm, Patient Position: Sitting, Cuff Size: Normal)   Pulse 73   Temp 97.7 F (36.5 C) (Oral)   Wt 136 lb 1.3 oz (61.7 kg)   SpO2 100%   BMI 24.11 kg/m  Wt Readings from Last 3 Encounters:  06/15/20 136 lb 1.3 oz (61.7 kg)  12/15/19 138 lb (62.6 kg)  11/13/18 137 lb (62.1 kg)     There are no preventive care reminders to display for this patient.  There are no preventive care reminders to display for this patient.  Lab Results  Component Value Date   TSH 1.59 11/14/2015   Lab Results  Component Value Date   WBC 7.1 04/19/2017   HGB 14.0 04/19/2017   HCT 42.5 04/19/2017   MCV 91.4 04/19/2017   PLT 414 (H) 04/19/2017   Lab Results  Component Value Date   NA 141 12/17/2019   K 4.1 12/17/2019   CO2 23 12/17/2019   GLUCOSE 90 12/17/2019   BUN 17 12/17/2019   CREATININE 0.86 12/17/2019   BILITOT 0.6 12/17/2019   ALKPHOS 67 12/12/2016   AST 19 12/17/2019   ALT 17 12/17/2019   PROT 6.6 12/17/2019   ALBUMIN 3.9 12/12/2016   CALCIUM 9.5 12/17/2019   Lab Results  Component Value Date   CHOL 207 (H) 12/17/2019   Lab Results  Component Value Date   HDL 61 12/17/2019   Lab Results  Component Value Date   LDLCALC 125 (H) 12/17/2019   Lab Results  Component Value Date   TRIG 106 12/17/2019   Lab Results  Component Value Date   CHOLHDL 3.4 12/17/2019   Lab Results  Component Value Date   HGBA1C 5.4 06/15/2020      Assessment & Plan:    Problem List Items Addressed This Visit      Endocrine   IFG (impaired fasting glucose)    Well controlled. Continue current regimen. Follow  up in  66mo        Musculoskeletal and Integument   Osteopenia    Dsicussed options.  She would like to try the Fosamax I am not sure what happened to the pharmacy so I did resend the prescription today if she does not get a call within 2-3 business days encouraged her to give [place] call back and let us know if she is having any difficulty at the pharmacy.  I discussed the benefits of regular weightbearing exercise she is not currently doing any regular exercise we also discussed the importance of calcium plus vitamin D supplementation.  She does take vitamin D but not calcium currently        Other   History of splenectomy - Primary    Dust updating her Menveo in addition starting her meningitis B vaccine.  He agrees to both.  She just had her flu vaccine 3 weeks ago per her report.      Relevant Orders   Meningococcal MCV4O(Menveo) (Completed)   Meningococcal B, OMV (Bexsero) (Completed)    Other Visit Diagnoses    Age-related osteoporosis without current pathological fracture       Relevant Medications   alendronate (FOSAMAX) 70 MG tablet   Need for meningococcal vaccination       Relevant Orders   Meningococcal MCV4O(Menveo) (Completed)   Meningococcal B, OMV (Bexsero) (Completed)   Pre-diabetes       Relevant Orders   POCT HgB A1C (Completed)      Meds ordered this encounter  Medications  . alendronate (FOSAMAX) 70 MG tablet    Sig: Take 1 tablet (70 mg total) by mouth once a week. Take with a full glass of water on an empty stomach.    Dispense:  12 tablet    Refill:  3    Follow-up: Return in about 6 months (around 12/14/2020) for Wellness EXam .    Beatrice Lecher, MD

## 2020-08-08 ENCOUNTER — Telehealth: Payer: Self-pay

## 2020-08-08 NOTE — Telephone Encounter (Signed)
Patient called with some concerns about her bill. She is very upset that she is having to pay over $800 for the two injections she received in office on 06/15/20. Patient states she has received these injections before, but never had to pay like this.   Patient is very upset- states she left a msg for Lacretia Nicks, but also wanted to just speak with someone. I listened to patient and let her know that Abigail Butts was out working in the clinic today and not at her desk, but I would relay the msg and ask that Abigail Butts try to look into this for patient and give her a call.   Abigail Butts- can you reach out to patein when you have an opportunity? Her best contact number is [phone].

## 2020-08-29 NOTE — Telephone Encounter (Signed)
I tried calling the patient at 3:30pm, but was not able to reach her to let her know I am still working on this bill.  I never got a response from the original person I sent the email to.  I sent an email to the billing manager, Graceann Congress and once she responds I will reach back out to [place].

## 2020-09-07 NOTE — Telephone Encounter (Signed)
I called to speak with Virginia Williams and she was out so I spoke with her husband and he stated he would give her the message that I called and I'm still waiting on a response from Virginia Williams as to why the insurance denied part of the claim.

## 2020-10-20 DIAGNOSIS — R0789 Other chest pain: Secondary | ICD-10-CM | POA: Diagnosis not present

## 2020-10-20 DIAGNOSIS — E785 Hyperlipidemia, unspecified: Secondary | ICD-10-CM | POA: Diagnosis not present

## 2020-10-20 DIAGNOSIS — R000 Tachycardia, unspecified: Secondary | ICD-10-CM | POA: Diagnosis not present

## 2020-10-20 DIAGNOSIS — I10 Essential (primary) hypertension: Secondary | ICD-10-CM | POA: Diagnosis not present

## 2020-10-20 DIAGNOSIS — R002 Palpitations: Secondary | ICD-10-CM | POA: Diagnosis not present

## 2020-10-21 DIAGNOSIS — Z79899 Other long term (current) drug therapy: Secondary | ICD-10-CM | POA: Diagnosis not present

## 2020-10-21 DIAGNOSIS — R791 Abnormal coagulation profile: Secondary | ICD-10-CM | POA: Diagnosis not present

## 2020-10-21 DIAGNOSIS — R7989 Other specified abnormal findings of blood chemistry: Secondary | ICD-10-CM | POA: Diagnosis not present

## 2020-10-21 DIAGNOSIS — E785 Hyperlipidemia, unspecified: Secondary | ICD-10-CM | POA: Diagnosis not present

## 2020-10-21 DIAGNOSIS — I712 Thoracic aortic aneurysm, without rupture: Secondary | ICD-10-CM | POA: Diagnosis not present

## 2020-10-21 DIAGNOSIS — Z888 Allergy status to other drugs, medicaments and biological substances status: Secondary | ICD-10-CM | POA: Diagnosis not present

## 2020-10-21 DIAGNOSIS — R0789 Other chest pain: Secondary | ICD-10-CM | POA: Diagnosis not present

## 2020-10-21 DIAGNOSIS — I471 Supraventricular tachycardia: Secondary | ICD-10-CM | POA: Diagnosis not present

## 2020-10-24 ENCOUNTER — Telehealth: Payer: Self-pay

## 2020-10-24 NOTE — Telephone Encounter (Signed)
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  Narrative  [id number] - 10/21/2020 3:02 PM EST  INDICATION: PE suspected, low/intermediate prob, positive D-dimer dimer done a wake cards. COMPARISON: None.  TECHNIQUE: Routine CT pulmonary angiogram with contrast was performed using Contrast: 80 mL mL of Isovue 370. Multiplanar 2D and angiographic 3D MIP images were constructed and reviewed. Radiation dose reduction was utilized (automated exposure control, mA or kV adjustment based on patient size, or iterative image reconstruction).  Contrast bolus quality: satisfactory  FINDINGS:  SUPPORT APPARATUS: N.A.   PULMONARY ARTERIES: No pulmonary emboli are identified. RV/LV ratio: <0.9 Interventricular septum: No bowing IVC/hepatic vein reflux: None  LUNGS/PLEURA:  No focal airspace opacities/consolidations. No pneumothorax.  No abnormal pulmonary masses.  No pleural effusions.  HEART/MEDIASTINUM: Cardiac size is normal. No acute thoracic aortic abnormalities. Mild aneurysmal dilatation of the ascending aorta measuring up to 4.1 cm. No hilar, mediastinal, or axillary lymphadenopathy.   MUSCULOSKELETAL: No acute or destructive osseous processes.  MISC: N.A.     CT Angio Pulmonary (10/21/2020 2:55 PM EST)  Procedure Note  Carrington Clamp, MD - 10/21/2020  Formatting of this note might be different from the original. INDICATION: PE suspected, low/intermediate prob, positive D-dimer dimer  done a wake cards. COMPARISON: None.  TECHNIQUE: Routine CT pulmonary angiogram with contrast was performed using Contrast: 80 mL mL of Isovue 370. Multiplanar 2D and angiographic 3D MIP images were constructed and reviewed. Radiation dose reduction was utilized (automated exposure control, mA or kV adjustment based on patient size, or iterative image reconstruction).  Contrast bolus quality: satisfactory  FINDINGS:  SUPPORT APPARATUS: N.A.   PULMONARY ARTERIES: No pulmonary emboli are identified. RV/LV ratio: <0.9 Interventricular septum: No bowing IVC/hepatic vein reflux:  None  LUNGS/PLEURA:  No focal airspace opacities/consolidations. No pneumothorax.  No abnormal pulmonary masses.  No pleural effusions.  HEART/MEDIASTINUM: Cardiac size is normal. No acute thoracic aortic abnormalities. Mild aneurysmal dilatation of the ascending aorta measuring up to 4.1 cm. No hilar, mediastinal, or axillary lymphadenopathy.   MUSCULOSKELETAL: No acute or destructive osseous processes.  MISC: N.A.   IMPRESSION: 1. No pulmonary embolism. 2. No acute findings.  3. 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       Electronically Signed by: Carrington Clamp on 10/21/2020 3:02 PM    CT Angio Pulmonary (10/21/2020 2:55 PM EST)  Authorizing Provider Result Type  Clemon Chambers PA-C IMG CT ORDERABLES   CT Angio Pulmonary (10/21/2020 2:55 PM EST)  Performing Organization Address City/State/ZIP Code Phone Number  [email or web address]           ECG Results - documented in this encounter   ECG 12 lead (10/21/2020 1:07 PM EST) ECG 12 lead (10/21/2020 1:07 PM EST)  Specimen (Source) Anatomical Location / Laterality Collection Method / Volume Collection Time Received Time     10/21/2020 1:07 PM EST 10/21/2020 7:18 PM EST   ECG 12 lead (10/21/2020 1:07 PM EST)  Narrative  [facility] - 10/21/2020 7:18 PM EST  Diagnosis Class Normal Acquisition Device D3K Ventricular Rate 75 Atrial Rate  75 P-R Interval 168 QRS Duration 82 Q-T Interval 388 QTC Calculation(Bazett) 433 Calculated P Axis 72 Calculated R Axis 16 Calculated T Axis 32  Diagnosis Normal sinus rhythm Normal ECG When compared with ECG of 21-Jul-2013 09:04, No significant change was found Virginia Williams ([id number]) on 4/31/5400 8:67:61 PM certifies that he/she has reviewed the ECG tracing and confirms the independent interpretation is correct.    ECG 12 lead (10/21/2020 1:07 PM EST)  Authorizing Provider Result Type  Clemon Chambers PA-C ECG ORDERABLES   ECG 12 lead (10/21/2020 1:07 PM EST)  Performing Organization Address City/State/ZIP Code Phone Number  [facility]          Visit Diagnoses - documented in this encounter  Diagnosis  Thoracic aortic aneurysm without rupture (*) - Primary   Elevated d-dimer     Administered Medications - documented in this encounter  Inactive Administered Medications - up to 3 most recent administrations Inactive Administered Medications - up to 3 most recent administrations  Medication Order [facility] Action Action Date Dose Rate Site  iopamidol (ISOVUE-370) 76% injection 80 mL  80 mL, IntraVENous, Once in imaging, Contrast, Starting on Fri 10/21/20 at 1430, For 1 dose, Protocol Order? Yes  Given 10/21/2020 2:55 PM EST 80 mLs       Inactive Administered Medications - up to 3 most recent administrations Inactive Administered Medications - up to 3 most recent administrations  Medication Order [facility] Action Action Date Dose Rate Site  iopamidol (ISOVUE-370) 76% injection 80 mL  80 mL, IntraVENous, Once in imaging, Contrast, Starting on Fri 10/21/20 at 1430, For 1 dose, Protocol Order? Yes  Given 10/21/2020 2:55 PM EST 80 mLs           Active and Recently Administered Medications - documented in this encounter  Legend Legend  Given Not Given Canceled Entry Hold Due Other Actions   Time (Time)  Time Time Time-Action This medication is part of a linked  order group. Click to view details.Times are shown in EST.   PRN PRN  Medication Order 10/19/2020 10/20/2020 10/21/2020  iopamidol (ISOVUE-370) 76% injection 80 mL (COMPLETED) 80 mL, IntraVENous, Once in imaging, Contrast, Starting on  Fri 10/21/20 at 1430, For 1 dose, Protocol Order? Yes     1455Given^02/11 1455^Provider: Alycia Briones, RT (R)^Dose: 80 mL^Route: IntraVENous     Orders - documented in this encounter  Medications Ordered That Might Not Have Been Administered Count Last Ordered Date First Ordered Date  iopamidol (ISOVUE-370) 76% injection 80 mL 1 10/21/2020     Care Teams - documented as of this encounter  Team Member Relationship Specialty Start Date End Date  Hali Marry, MD  [address]  [place], [place] [address]  [phone] (Work)  [phone] (Fax)  PCP - General Family Medicine 11/21/11   Raj Janus, MD  [place], [place] [address]  [phone] (Work)  [phone] (Fax)  Consulting Physician Cardiology 05/21/13   Berenda Morale, [place]  [place], [place] [address]  [phone] (Work)  [phone] (Fax)  Consulting Physician Gastroenterology 12/18/13   Serita Kyle, MD  [phone] (Work)  [phone] (Fax)  Consulting Physician Interventional Cardiology 01/29/14   Emmit Alexanders, MD  [phone] (Work)  [phone] (Fax)  Consulting Physician General Surgery 10/14/17   Janie Morning, MD  [place], [place] [address]  [phone] (Work)  [phone] (Fax)  Consulting Physician Gastroenterology 03/30/19     Images Patient Demographics  Patient Address Communication Language Race / Ethnicity Marital Status  [address] [place], [place] [address] [phone] (Home) [email or web address] English - Written (Preferred) Dema Severin / Not Hispanic or Latino Married   Patient Contacts  Contact Name  Contact Address Communication Relationship to Patient  Dniyah Grant [address] [place], [place] [address] [phone] (Home) Spouse, Agent   Document Information  Primary Care Provider Other Service Providers Document Coverage Dates  Hali Marry, MD (Mar. 13, 2013March 13, 2013 - Present) [phone] (Work) [phone] (Fax) [address] [place], [place] [address] Family Medicine [facility], [place] [address] Raj Janus, MD (Consulting Physician) [phone] (Work) [phone] (Fax) [place], [place] [address] Cardiology [place], [place] [address] Berenda Morale, MD (Consulting Physician) [phone] (Work) [phone] (Fax) [place], [place] [address] Gastroenterology [place], [place] [address] Serita Kyle, MD (Consulting Physician) [phone] (Work) [phone] (Fax)  Rowan, MD (Consulting Physician) [phone] (Work) [phone] (Fax)  Weaver D Cherry, MD (Consulting Physician) [phone] (Work) [phone] (Fax) [place], [place] [address] Gastroenterology [place], [place] [address] Feb. 11, 2022February 11, 2022   [place],  [address]   Encounter Providers Encounter Date   Feb. 11, 2022February 11, 2022   Legal Authenticator   Sarina Ill      Show All Sections

## 2020-10-24 NOTE — Telephone Encounter (Signed)
Transition Care Management Follow-up Telephone Call  Date of discharge and from where: 10/21/20 from [place]  How have you been since you were released from the hospital? Pt stated that she is feeling okay but is concerned about the imaging results. She stated that she has an aneurysm and wants to have it evaluated further.   Any questions or concerns? No  Items Reviewed:  Did the pt receive and understand the discharge instructions provided? Yes   Medications obtained and verified? Yes   Other? No   Any new allergies since your discharge? No   Dietary orders reviewed? [employer]  Do you have support at home? Yes   Functional Questionnaire: (I = Independent and D = Dependent) ADLs: I  Bathing/Dressing- I  Meal Prep- I  Eating- I  Maintaining continence- I  Transferring/Ambulation- I  Managing Meds- I  Follow up appointments reviewed:   PCP Hospital f/u appt confirmed? No    Specialist Hospital f/u appt confirmed? No  Pt is going to call her cardiologist.   Are transportation arrangements needed? No   If their condition worsens, is the pt aware to call PCP or go to the Emergency Dept.? Yes  Was the patient provided with contact information for the PCP's office or ED? Yes  Was to pt encouraged to call back with questions or concerns? Yes

## 2020-12-15 ENCOUNTER — Ambulatory Visit (INDEPENDENT_AMBULATORY_CARE_PROVIDER_SITE_OTHER): Payer: Medicare Other | Admitting: Family Medicine

## 2020-12-15 ENCOUNTER — Other Ambulatory Visit: Payer: Self-pay

## 2020-12-15 ENCOUNTER — Encounter: Payer: Self-pay | Admitting: Family Medicine

## 2020-12-15 VITALS — BP 128/62 | HR 78 | Ht 63.0 in | Wt 141.0 lb

## 2020-12-15 DIAGNOSIS — Z0000 Encounter for general adult medical examination without abnormal findings: Secondary | ICD-10-CM

## 2020-12-15 DIAGNOSIS — E7800 Pure hypercholesterolemia, unspecified: Secondary | ICD-10-CM

## 2020-12-15 DIAGNOSIS — G43009 Migraine without aura, not intractable, without status migrainosus: Secondary | ICD-10-CM

## 2020-12-15 DIAGNOSIS — R7301 Impaired fasting glucose: Secondary | ICD-10-CM

## 2020-12-15 DIAGNOSIS — M810 Age-related osteoporosis without current pathological fracture: Secondary | ICD-10-CM

## 2020-12-15 MED ORDER — PRAVASTATIN SODIUM 10 MG PO TABS: 10.0000 mg | ORAL_TABLET | Freq: Every day | ORAL | 3 refills | Status: AC

## 2020-12-15 MED ORDER — TOPIRAMATE 50 MG PO TABS: 50.0000 mg | ORAL_TABLET | Freq: Two times a day (BID) | ORAL | 2 refills | Status: DC

## 2020-12-15 NOTE — Progress Notes (Signed)
Subjective:     Virginia Williams is a 75 y.o. female and is here for a comprehensive physical exam. The patient reports no problems. She is doing well.  She is up-to-date on all of her preventative care.  She would like to get labs including her A1c.  She exercises some, walking once a week.  Recent chest pain, shortness of breath.  She had a hysterectomy and since then she has felt very constipated.  She never really had major problems before.  She has been using Metamucil and sometimes stool softeners.  Health Maintenance  Topic Date Due  . MAMMOGRAM  04/04/2021  . INFLUENZA VACCINE  04/10/2021  . COLONOSCOPY (Pts 45-81yrs Insurance coverage will need to be confirmed)  12/18/2023  . TETANUS/TDAP  11/21/2027  . DEXA SCAN  Completed  . COVID-19 Vaccine  Completed  . Hepatitis C Screening  Completed  . PNA vac Low Risk Adult  Completed  . HPV VACCINES  Aged Out      Social History   Socioeconomic History  . Marital status: Married    Spouse name: Not on file  . Number of children: Not on file  . Years of education: Not on file  . Highest education level: Not on file  Occupational History  . Not on file  Tobacco Use  . Smoking status: Never Smoker  . Smokeless tobacco: Never Used  Substance and Sexual Activity  . Alcohol use: No  . Drug use: No  . Sexual activity: Not on file  Other Topics Concern  . Not on file  Social History Narrative  . Not on file   Social Determinants of Health   Financial Resource Strain: Not on file  Food Insecurity: Not on file  Transportation Needs: Not on file  Physical Activity: Not on file  Stress: Not on file  Social Connections: Not on file  Intimate Partner Violence: Not on file   Health Maintenance  Topic Date Due  . MAMMOGRAM  04/04/2021  . INFLUENZA VACCINE  04/10/2021  . COLONOSCOPY (Pts 45-62yrs Insurance coverage will need to be confirmed)  12/18/2023  . TETANUS/TDAP  11/21/2027  . DEXA SCAN  Completed  . COVID-19  Vaccine  Completed  . Hepatitis C Screening  Completed  . PNA vac Low Risk Adult  Completed  . HPV VACCINES  Aged Out    The following portions of the patient's history were reviewed and updated as appropriate: allergies, current medications, past family history, past medical history, past social history, past surgical history and problem list.  Review of Systems A comprehensive review of systems was negative.   Objective:    BP 128/62   Pulse 78   Ht 5\' 3"  (1.6 m)   Wt 141 lb (64 kg)   SpO2 99%   BMI 24.98 kg/m  General appearance: alert, cooperative and appears stated age Head: Normocephalic, without obvious abnormality, atraumatic Eyes: conj claer, EOMI, PEERLA Ears: normal TM's and external ear canals both ears Nose: Nares normal. Septum midline. Mucosa normal. No drainage or sinus tenderness. Throat: lips, mucosa, and tongue normal; teeth and gums normal Neck: no adenopathy, no carotid bruit, no JVD, supple, symmetrical, trachea midline and thyroid not enlarged, symmetric, no tenderness/mass/nodules Back: symmetric, no curvature. ROM normal. No CVA tenderness. Lungs: clear to auscultation bilaterally Heart: regular rate and rhythm, S1, S2 normal, no murmur, click, rub or gallop Abdomen: soft, non-tender; bowel sounds normal; no masses,  no organomegaly Extremities: extremities normal, atraumatic, no cyanosis or edema  Pulses: 2+ and symmetric Skin: Skin color, texture, turgor normal. No rashes or lesions Lymph nodes: Cervical, supraclavicular, and axillary nodes normal. Neurologic: Alert and oriented X 3, normal strength and tone. Normal symmetric reflexes. Normal coordination and gait    Assessment:    Healthy female exam.      Plan:     See After Visit Summary for Counseling Recommendations   Keep up a regular exercise program and make sure you are eating a healthy diet Try to eat 4 servings of dairy a day, or if you are lactose intolerant take a calcium with  vitamin D daily.  Your vaccines are up to date.  Did encourage her to try to increase her walking routine to 3 days/week.  Constipation-recommend switching to Benefiber which she could add to her drinks during the day.  And use MiraLAX as needed.

## 2020-12-15 NOTE — Progress Notes (Deleted)
Established Patient Office Visit  Subjective:  Patient ID: Virginia Williams, female    DOB: 02-21-1946  Age: 75 y.o. MRN: [id number]  CC: No chief complaint on file.   HPI Virginia Williams presents for   Impaired fasting glucose-no increased thirst or urination. No symptoms consistent with hypoglycemia.  Hyperlipidemia - tolerating stating well with no myalgias or significant side effects.  Lab Results  Component Value Date   CHOL 207 (H) 12/17/2019   HDL 61 12/17/2019   LDLCALC 125 (H) 12/17/2019   LDLDIRECT 129 (H) 06/14/2010   TRIG 106 12/17/2019   CHOLHDL 3.4 12/17/2019      Past Medical History:  Diagnosis Date  . C. difficile colitis    post op from cipro  . Hyperlipidemia   . Meningioma ([place])   . Normal stress echocardiogram 09/2005   [facility]  . Staph infection    post op    Past Surgical History:  Procedure Laterality Date  . ATRIAL ABLATION SURGERY     for AVNRT at [facility]  . BREAST SURGERY     breast implants- silicone  . VENTRAL HERNIA REPAIR  10/2010   Dr. Franz Dell    Family History  Problem Relation Age of Onset  . Hypertension Mother     Social History   Socioeconomic History  . Marital status: Married    Spouse name: Not on file  . Number of children: Not on file  . Years of education: Not on file  . Highest education level: Not on file  Occupational History  . Not on file  Tobacco Use  . Smoking status: Never Smoker  . Smokeless tobacco: Never Used  Substance and Sexual Activity  . Alcohol use: No  . Drug use: No  . Sexual activity: Not on file  Other Topics Concern  . Not on file  Social History Narrative  . Not on file   Social Determinants of Health   Financial Resource Strain: Not on file  Food Insecurity: Not on file  Transportation Needs: Not on file  Physical Activity: Not on file  Stress: Not on file  Social Connections: Not on file  Intimate Partner Violence: Not on file    Outpatient  Medications Prior to Visit  Medication Sig Dispense Refill  . alendronate (FOSAMAX) 70 MG tablet Take 1 tablet (70 mg total) by mouth once a week. Take with a full glass of water on an empty stomach. 12 tablet 3  . Biotin 5000 MCG CAPS Take by mouth.    . Multiple Vitamins-Minerals (MULTIVITAMIN ADULT PO) Take by mouth.    . pravastatin (PRAVACHOL) 10 MG tablet Take 1 tablet (10 mg total) by mouth daily. 90 tablet 3  . topiramate (TOPAMAX) 50 MG tablet TAKE ONE TABLET BY MOUTH TWICE A DAY 180 tablet 2   No facility-administered medications prior to visit.    Allergies  Allergen Reactions  . Simvastatin Other (See Comments)    Headache  . Atorvastatin Other (See Comments)    Myalgias    ROS Review of Systems    Objective:    Physical Exam Constitutional:      Appearance: She is well-developed.  HENT:     Head: Normocephalic and atraumatic.  Cardiovascular:     Rate and Rhythm: Normal rate and regular rhythm.     Heart sounds: Normal heart sounds.  Pulmonary:     Effort: Pulmonary effort is normal.     Breath sounds: Normal breath sounds.  Skin:    General: Skin is warm and dry.  Neurological:     Mental Status: She is alert and oriented to person, place, and time.  Psychiatric:        Behavior: Behavior normal.     There were no vitals taken for this visit. Wt Readings from Last 3 Encounters:  06/15/20 136 lb 1.3 oz (61.7 kg)  12/15/19 138 lb (62.6 kg)  11/13/18 137 lb (62.1 kg)     There are no preventive care reminders to display for this patient.  There are no preventive care reminders to display for this patient.  Lab Results  Component Value Date   TSH 1.59 11/14/2015   Lab Results  Component Value Date   WBC 7.1 04/19/2017   HGB 14.0 04/19/2017   HCT 42.5 04/19/2017   MCV 91.4 04/19/2017   PLT 414 (H) 04/19/2017   Lab Results  Component Value Date   NA 141 12/17/2019   K 4.1 12/17/2019   CO2 23 12/17/2019   GLUCOSE 90 12/17/2019   BUN 17  12/17/2019   CREATININE 0.86 12/17/2019   BILITOT 0.6 12/17/2019   ALKPHOS 67 12/12/2016   AST 19 12/17/2019   ALT 17 12/17/2019   PROT 6.6 12/17/2019   ALBUMIN 3.9 12/12/2016   CALCIUM 9.5 12/17/2019   Lab Results  Component Value Date   CHOL 207 (H) 12/17/2019   Lab Results  Component Value Date   HDL 61 12/17/2019   Lab Results  Component Value Date   LDLCALC 125 (H) 12/17/2019   Lab Results  Component Value Date   TRIG 106 12/17/2019   Lab Results  Component Value Date   CHOLHDL 3.4 12/17/2019   Lab Results  Component Value Date   HGBA1C 5.4 06/15/2020      Assessment & Plan:   Problem List Items Addressed This Visit      Endocrine   IFG (impaired fasting glucose) - Primary     Other   HYPERCHOLESTEROLEMIA      No orders of the defined types were placed in this encounter.   Follow-up: No follow-ups on file.    Beatrice Lecher, MD

## 2020-12-15 NOTE — Patient Instructions (Signed)
Preventive Care 61 Years and Older, Female Preventive care refers to lifestyle choices and visits with your health care provider that can promote health and wellness. This includes:  A yearly physical exam. This is also called an annual wellness visit.  Regular dental and eye exams.  Immunizations.  Screening for certain conditions.  Healthy lifestyle choices, such as: ? Eating a healthy diet. ? Getting regular exercise. ? Not using drugs or products that contain nicotine and tobacco. ? Limiting alcohol use. What can I expect for my preventive care visit? Physical exam Your health care provider will check your:  Height and weight. These may be used to calculate your BMI (body mass index). BMI is a measurement that tells if you are at a healthy weight.  Heart rate and blood pressure.  Body temperature.  Skin for abnormal spots. Counseling Your health care provider may ask you questions about your:  Past medical problems.  Family's medical history.  Alcohol, tobacco, and drug use.  Emotional well-being.  Home life and relationship well-being.  Sexual activity.  Diet, exercise, and sleep habits.  History of falls.  Memory and ability to understand (cognition).  Work and work Statistician.  Pregnancy and menstrual history.  Access to firearms. What immunizations do I need? Vaccines are usually given at various ages, according to a schedule. Your health care provider will recommend vaccines for you based on your age, medical history, and lifestyle or other factors, such as travel or where you work.   What tests do I need? Blood tests  Lipid and cholesterol levels. These may be checked every 5 years, or more often depending on your overall health.  Hepatitis C test.  Hepatitis B test. Screening  Lung cancer screening. You may have this screening every year starting at age 74 if you have a 30-pack-year history of smoking and currently smoke or have quit within  the past 15 years.  Colorectal cancer screening. ? All adults should have this screening starting at age 44 and continuing until age 58. ? Your health care provider may recommend screening at age 2 if you are at increased risk. ? You will have tests every 1-10 years, depending on your results and the type of screening test.  Diabetes screening. ? This is done by checking your blood sugar (glucose) after you have not eaten for a while (fasting). ? You may have this done every 1-3 years.  Mammogram. ? This may be done every 1-2 years. ? Talk with your health care provider about how often you should have regular mammograms.  Abdominal aortic aneurysm (AAA) screening. You may need this if you are a current or former smoker.  BRCA-related cancer screening. This may be done if you have a family history of breast, ovarian, tubal, or peritoneal cancers. Other tests  STD (sexually transmitted disease) testing, if you are at risk.  Bone density scan. This is done to screen for osteoporosis. You may have this done starting at age 104. Talk with your health care provider about your test results, treatment options, and if necessary, the need for more tests. Follow these instructions at home: Eating and drinking  Eat a diet that includes fresh fruits and vegetables, whole grains, lean protein, and low-fat dairy products. Limit your intake of foods with high amounts of sugar, saturated fats, and salt.  Take vitamin and mineral supplements as recommended by your health care provider.  Do not drink alcohol if your health care provider tells you not to drink.  If you drink alcohol: ? Limit how much you have to 0-1 drink a day. ? Be aware of how much alcohol is in your drink. In the [place] one drink equals one 12 oz bottle of beer (355 mL), one 5 oz glass of wine (148 mL), or one 1 oz glass of hard liquor (44 mL).   Lifestyle  Take daily care of your teeth and gums. Brush your teeth every morning  and night with fluoride toothpaste. Floss one time each day.  Stay active. Exercise for at least 30 minutes 5 or more days each week.  Do not use any products that contain nicotine or tobacco, such as cigarettes, e-cigarettes, and chewing tobacco. If you need help quitting, ask your health care provider.  Do not use drugs.  If you are sexually active, practice safe sex. Use a condom or other form of protection in order to prevent STIs (sexually transmitted infections).  Talk with your health care provider about taking a low-dose aspirin or statin.  Find healthy ways to cope with stress, such as: ? Meditation, yoga, or listening to music. ? Journaling. ? Talking to a trusted person. ? Spending time with friends and family. Safety  Always wear your seat belt while driving or riding in a vehicle.  Do not drive: ? If you have been drinking alcohol. Do not ride with someone who has been drinking. ? When you are tired or distracted. ? While texting.  Wear a helmet and other protective equipment during sports activities.  If you have firearms in your house, make sure you follow all gun safety procedures. What's next?  Visit your health care provider once a year for an annual wellness visit.  Ask your health care provider how often you should have your eyes and teeth checked.  Stay up to date on all vaccines. This information is not intended to replace advice given to you by your health care provider. Make sure you discuss any questions you have with your health care provider. Document Revised: 08/17/2020 Document Reviewed: 08/21/2018 Elsevier Patient Education  2021 Elsevier Inc.  

## 2020-12-22 DIAGNOSIS — R7301 Impaired fasting glucose: Secondary | ICD-10-CM | POA: Diagnosis not present

## 2020-12-22 DIAGNOSIS — E7800 Pure hypercholesterolemia, unspecified: Secondary | ICD-10-CM | POA: Diagnosis not present

## 2020-12-22 DIAGNOSIS — M810 Age-related osteoporosis without current pathological fracture: Secondary | ICD-10-CM | POA: Diagnosis not present

## 2020-12-23 LAB — LIPID PANEL
Cholesterol: 219 mg/dL — ABNORMAL HIGH (ref <200)
HDL: 57 mg/dL (ref >=50)
LDL Cholesterol (Calc): 135 mg/dL (calc) — ABNORMAL HIGH
Non-HDL Cholesterol (Calc): 162 mg/dL (calc) — ABNORMAL HIGH (ref <130)
Total CHOL/HDL Ratio: 3.8 (calc) (ref <5.0)
Triglycerides: 148 mg/dL (ref <150)

## 2020-12-23 LAB — CBC
HCT: 43.1 % (ref 35.0–45.0)
Hemoglobin: 14 g/dL (ref 11.7–15.5)
MCH: 29.5 pg (ref 27.0–33.0)
MCHC: 32.5 g/dL (ref 32.0–36.0)
MCV: 90.9 fL (ref 80.0–100.0)
MPV: 9.5 fL (ref 7.5–12.5)
Platelets: 419 10*3/uL — ABNORMAL HIGH (ref 140–400)
RBC: 4.74 10*6/uL (ref 3.80–5.10)
RDW: 12.2 % (ref 11.0–15.0)
WBC: 6 10*3/uL (ref 3.8–10.8)

## 2020-12-23 LAB — COMPLETE METABOLIC PANEL WITH GFR
AG Ratio: 1.9 (calc) (ref 1.0–2.5)
ALT: 15 U/L (ref 6–29)
AST: 17 U/L (ref 10–35)
Albumin: 4.2 g/dL (ref 3.6–5.1)
Alkaline phosphatase (APISO): 73 U/L (ref 37–153)
BUN: 21 mg/dL (ref 7–25)
CO2: 23 mmol/L (ref 20–32)
Calcium: 9.1 mg/dL (ref 8.6–10.4)
Chloride: 111 mmol/L — ABNORMAL HIGH (ref 98–110)
Creat: 0.9 mg/dL (ref 0.60–0.93)
GFR, Est African American: 72 mL/min/{1.73_m2} (ref >=60)
GFR, Est Non African American: 63 mL/min/{1.73_m2} (ref >=60)
Globulin: 2.2 g/dL (calc) (ref 1.9–3.7)
Glucose, Bld: 87 mg/dL (ref 65–99)
Potassium: 4.1 mmol/L (ref 3.5–5.3)
Sodium: 143 mmol/L (ref 135–146)
Total Bilirubin: 0.6 mg/dL (ref 0.2–1.2)
Total Protein: 6.4 g/dL (ref 6.1–8.1)

## 2020-12-23 LAB — TSH: TSH: 1.38 mIU/L (ref 0.40–4.50)

## 2020-12-23 LAB — HEMOGLOBIN A1C
Hgb A1c MFr Bld: 5.4 % of total Hgb (ref <5.7)
Mean Plasma Glucose: 108 mg/dL
eAG (mmol/L): 6 mmol/L

## 2021-01-12 ENCOUNTER — Other Ambulatory Visit: Payer: Self-pay | Admitting: Family Medicine

## 2021-01-12 DIAGNOSIS — I771 Stricture of artery: Secondary | ICD-10-CM | POA: Diagnosis not present

## 2021-01-12 DIAGNOSIS — I517 Cardiomegaly: Secondary | ICD-10-CM | POA: Diagnosis not present

## 2021-01-12 DIAGNOSIS — R42 Dizziness and giddiness: Secondary | ICD-10-CM | POA: Diagnosis not present

## 2021-01-12 DIAGNOSIS — J929 Pleural plaque without asbestos: Secondary | ICD-10-CM | POA: Diagnosis not present

## 2021-01-12 DIAGNOSIS — E7800 Pure hypercholesterolemia, unspecified: Secondary | ICD-10-CM

## 2021-01-12 DIAGNOSIS — R9431 Abnormal electrocardiogram [ECG] [EKG]: Secondary | ICD-10-CM | POA: Diagnosis not present

## 2021-01-12 DIAGNOSIS — K449 Diaphragmatic hernia without obstruction or gangrene: Secondary | ICD-10-CM | POA: Diagnosis not present

## 2021-01-12 DIAGNOSIS — R079 Chest pain, unspecified: Secondary | ICD-10-CM | POA: Diagnosis not present

## 2021-01-31 DIAGNOSIS — I10 Essential (primary) hypertension: Secondary | ICD-10-CM | POA: Diagnosis not present

## 2021-01-31 DIAGNOSIS — R000 Tachycardia, unspecified: Secondary | ICD-10-CM | POA: Diagnosis not present

## 2021-01-31 DIAGNOSIS — R42 Dizziness and giddiness: Secondary | ICD-10-CM | POA: Diagnosis not present

## 2021-01-31 DIAGNOSIS — R002 Palpitations: Secondary | ICD-10-CM | POA: Diagnosis not present

## 2021-02-02 DIAGNOSIS — R42 Dizziness and giddiness: Secondary | ICD-10-CM | POA: Diagnosis not present

## 2021-02-02 DIAGNOSIS — R531 Weakness: Secondary | ICD-10-CM | POA: Diagnosis not present

## 2021-02-07 DIAGNOSIS — H5213 Myopia, bilateral: Secondary | ICD-10-CM | POA: Diagnosis not present

## 2021-02-20 DIAGNOSIS — Z86011 Personal history of benign neoplasm of the brain: Secondary | ICD-10-CM | POA: Diagnosis not present

## 2021-02-20 DIAGNOSIS — R42 Dizziness and giddiness: Secondary | ICD-10-CM | POA: Diagnosis not present

## 2021-02-20 DIAGNOSIS — Z9889 Other specified postprocedural states: Secondary | ICD-10-CM | POA: Diagnosis not present

## 2021-02-20 DIAGNOSIS — G9389 Other specified disorders of brain: Secondary | ICD-10-CM | POA: Diagnosis not present

## 2021-02-28 DIAGNOSIS — H818X1 Other disorders of vestibular function, right ear: Secondary | ICD-10-CM | POA: Diagnosis not present

## 2021-04-18 DIAGNOSIS — Z1231 Encounter for screening mammogram for malignant neoplasm of breast: Secondary | ICD-10-CM | POA: Diagnosis not present

## 2021-04-18 DIAGNOSIS — Z01419 Encounter for gynecological examination (general) (routine) without abnormal findings: Secondary | ICD-10-CM | POA: Diagnosis not present

## 2021-04-18 LAB — HM MAMMOGRAPHY

## 2021-05-24 ENCOUNTER — Encounter: Payer: Self-pay | Admitting: Family Medicine

## 2021-06-17 ENCOUNTER — Other Ambulatory Visit: Payer: Self-pay | Admitting: Family Medicine

## 2021-06-17 DIAGNOSIS — G43009 Migraine without aura, not intractable, without status migrainosus: Secondary | ICD-10-CM

## 2021-06-20 ENCOUNTER — Ambulatory Visit (INDEPENDENT_AMBULATORY_CARE_PROVIDER_SITE_OTHER): Payer: Medicare Other | Admitting: Family Medicine

## 2021-06-20 ENCOUNTER — Encounter: Payer: Self-pay | Admitting: Family Medicine

## 2021-06-20 ENCOUNTER — Other Ambulatory Visit: Payer: Self-pay

## 2021-06-20 VITALS — BP 133/77 | HR 79 | Ht 63.0 in | Wt 131.0 lb

## 2021-06-20 DIAGNOSIS — M25562 Pain in left knee: Secondary | ICD-10-CM

## 2021-06-20 DIAGNOSIS — E7800 Pure hypercholesterolemia, unspecified: Secondary | ICD-10-CM

## 2021-06-20 DIAGNOSIS — G43009 Migraine without aura, not intractable, without status migrainosus: Secondary | ICD-10-CM

## 2021-06-20 DIAGNOSIS — R7301 Impaired fasting glucose: Secondary | ICD-10-CM

## 2021-06-20 LAB — POCT GLYCOSYLATED HEMOGLOBIN (HGB A1C): Hemoglobin A1C: 5.3 % (ref 4.0–5.6)

## 2021-06-20 NOTE — Assessment & Plan Note (Signed)
We will switch back to local pharmacy for the Topamax to see if this makes a difference but if she really is having an increasing occurrence of headaches I would like to consider MRI of her brain since she does have a prior history of a meningioma that had to be removed.  I would just want to rule out any recurrence.  But hopefully the migraines will improve.

## 2021-06-20 NOTE — Assessment & Plan Note (Signed)
Well controlled. Continue current regimen. Follow up in  12 mo  

## 2021-06-20 NOTE — Progress Notes (Addendum)
Established Patient Office Visit  Subjective:  Patient ID: Virginia Williams, female    DOB: March 29, 1946  Age: 75 y.o. MRN: [id number]  CC: No chief complaint on file.   HPI Virginia Williams presents for   Follow-up migraine headaches-ever since she switched to the mail order generic of Topamax she feels like its not been as effective her headaches have actually been more frequent.  She says she also noticed right around that time that she started having some left knee pain and swelling she does not member any specific trauma or injury but felt like it might be related to the Topamax she would like to continue with the Topamax but would like to have it sent to the local pharmacy as instead.  He does have a history of a meningioma that required surgery  She also reports that she had a significant episode of dizziness while on vacation.  She ended up seeing neurology and they ended up referring to a vestibular specialist and diagnosed her with some type of viral illness she says overall she is better but will still get a buzzing noise in her ears especially if she turns her head.  She does have a follow-up later this month coming up with neurology again.  Past Medical History:  Diagnosis Date   C. difficile colitis    post op from cipro   Hyperlipidemia    Meningioma ([place])    Normal stress echocardiogram 09/2005   [facility]   Staph infection    post op    Past Surgical History:  Procedure Laterality Date   ATRIAL ABLATION SURGERY     for AVNRT at [place]     breast implants- silicone   VENTRAL HERNIA REPAIR  10/2010   Dr. Franz Dell    Family History  Problem Relation Age of Onset   Hypertension Mother     Social History   Socioeconomic History   Marital status: Married    Spouse name: Not on file   Number of children: Not on file   Years of education: Not on file   Highest education level: Not on file  Occupational History   Not on file   Tobacco Use   Smoking status: Never   Smokeless tobacco: Never  Substance and Sexual Activity   Alcohol use: No   Drug use: No   Sexual activity: Not on file  Other Topics Concern   Not on file  Social History Narrative   Not on file   Social Determinants of Health   Financial Resource Strain: Not on file  Food Insecurity: Not on file  Transportation Needs: Not on file  Physical Activity: Not on file  Stress: Not on file  Social Connections: Not on file  Intimate Partner Violence: Not on file    Outpatient Medications Prior to Visit  Medication Sig Dispense Refill   Biotin 5000 MCG CAPS Take by mouth.     Multiple Vitamins-Minerals (MULTIVITAMIN ADULT PO) Take by mouth.     pravastatin (PRAVACHOL) 10 MG tablet Take 1 tablet (10 mg total) by mouth daily. 90 tablet 3   topiramate (TOPAMAX) 50 MG tablet TAKE ONE TABLET BY MOUTH TWICE A DAY 180 tablet 0   No facility-administered medications prior to visit.    Allergies  Allergen Reactions   Simvastatin Other (See Comments)    Headache   Atorvastatin Other (See Comments)    Myalgias    ROS Review of Systems  Objective:    Physical Exam Constitutional:      Appearance: Normal appearance. She is well-developed.  HENT:     Head: Normocephalic and atraumatic.  Cardiovascular:     Rate and Rhythm: Normal rate and regular rhythm.     Heart sounds: Normal heart sounds.  Pulmonary:     Effort: Pulmonary effort is normal.     Breath sounds: Normal breath sounds.  Skin:    General: Skin is warm and dry.  Neurological:     Mental Status: She is alert and oriented to person, place, and time.  Psychiatric:        Behavior: Behavior normal.    BP 133/77   Pulse 79   Ht 5\' 3"  (1.6 m)   Wt 131 lb (59.4 kg)   SpO2 98%   BMI 23.21 kg/m  Wt Readings from Last 3 Encounters:  06/20/21 131 lb (59.4 kg)  12/15/20 141 lb (64 kg)  06/15/20 136 lb 1.3 oz (61.7 kg)     There are no preventive care reminders to  display for this patient.  There are no preventive care reminders to display for this patient.  Lab Results  Component Value Date   TSH 1.38 12/22/2020   Lab Results  Component Value Date   WBC 6.0 12/22/2020   HGB 14.0 12/22/2020   HCT 43.1 12/22/2020   MCV 90.9 12/22/2020   PLT 419 (H) 12/22/2020   Lab Results  Component Value Date   NA 143 12/22/2020   K 4.1 12/22/2020   CO2 23 12/22/2020   GLUCOSE 87 12/22/2020   BUN 21 12/22/2020   CREATININE 0.90 12/22/2020   BILITOT 0.6 12/22/2020   ALKPHOS 67 12/12/2016   AST 17 12/22/2020   ALT 15 12/22/2020   PROT 6.4 12/22/2020   ALBUMIN 3.9 12/12/2016   CALCIUM 9.1 12/22/2020   Lab Results  Component Value Date   CHOL 219 (H) 12/22/2020   Lab Results  Component Value Date   HDL 57 12/22/2020   Lab Results  Component Value Date   LDLCALC 135 (H) 12/22/2020   Lab Results  Component Value Date   TRIG 148 12/22/2020   Lab Results  Component Value Date   CHOLHDL 3.8 12/22/2020   Lab Results  Component Value Date   HGBA1C 5.3 06/20/2021      Assessment & Plan:   Problem List Items Addressed This Visit       Cardiovascular and Mediastinum   Migraine headache    We will switch back to local pharmacy for the Topamax to see if this makes a difference but if she really is having an increasing occurrence of headaches I would like to consider MRI of her brain since she does have a prior history of a meningioma that had to be removed.  I would just want to rule out any recurrence.  But hopefully the migraines will improve.         Endocrine   IFG (impaired fasting glucose) - Primary    Well controlled. Continue current regimen. Follow up in  12 mo       Relevant Orders   POCT glycosylated hemoglobin (Hb A1C) (Completed)     Other   HYPERCHOLESTEROLEMIA    Tolerating pravastatin well.  Last LDL was elevated though.      Acute pain of left knee    No specific injury or trauma suspect arthritis.  She has  had some swelling as well so consider other options such  as meniscal tear etc. she does feel like its been gradually getting a little bit better.  So we discussed using a topical Voltaren gel.  And occasionally taking an NSAID if she needs to.  If not improving over the next couple of weeks then please let me know.  We also discussed the importance of wearing good supportive shoe wear.  Consider formal PT if needed and we can always get formal x-rays if needed if not improving.       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  Ear buzzing-did encourage her to speak with the neurologist about it when she sees him back the dizziness that self is resolved but she is getting that noise.  She says sometimes it even sounds like a thump but its not necessarily in tune with her pulse so again encouraged her to speak with him about this.  No orders of the defined types were placed in this encounter.   Follow-up: Return in about 6 months (around 12/19/2021) for Migraines.      Beatrice Lecher, MD

## 2021-06-20 NOTE — Assessment & Plan Note (Signed)
No specific injury or trauma suspect arthritis.  She has had some swelling as well so consider other options such as meniscal tear etc. she does feel like its been gradually getting a little bit better.  So we discussed using a topical Voltaren gel.  And occasionally taking an NSAID if she needs to.  If not improving over the next couple of weeks then please let me know.  We also discussed the importance of wearing good supportive shoe wear.  Consider formal PT if needed and we can always get formal x-rays if needed if not improving.

## 2021-06-20 NOTE — Assessment & Plan Note (Signed)
Tolerating pravastatin well.  Last LDL was elevated though.

## 2021-06-20 NOTE — Patient Instructions (Signed)
Volteran gel for your knee.   We can get an xray if you feel it is not improving over the next months.

## 2021-06-22 ENCOUNTER — Telehealth: Payer: Self-pay | Admitting: *Deleted

## 2021-06-22 NOTE — Telephone Encounter (Signed)
Pt called and stated that her medication wasn't at the pharmacy. She said that when she called she was told that we had not sent it.   I called the pharmacy and spoke with [facility] and she stated that pt had p/u the medication on 10/11.   Called pt back and informed her of this. She said that she had to pay for this and that this wasn't ran thru her insurance.???  I called the pharmacy back and spoke w/Kong he stated that she did use a good rx card and that he was also confused as to what she was really wanting and understood that what pt was wanting to do was pick up the prescription that was there and then have another one on file.  I called the pt back and she said that was what she was trying to accomplish to be sure that she didn't run out of the medication. I told her  that all she needed to do was call 1 week before she ran out and we would send the refill in. She voiced understanding and agreed.  She asked that we take the mail order pharmacy off she no longer wishes to get her medications from there.   She also asked about whether or not she would need any additional shots due to her spleen issues. I informed her that I didn't think that she did however, I would forward this to Dr. Madilyn Fireman for advice and get back to her.

## 2021-06-23 NOTE — Telephone Encounter (Signed)
I'm sure she would be fine with that. Thanks! I will let her know.

## 2021-06-23 NOTE — Telephone Encounter (Signed)
I will send her vaccine sheet with recommendations via mail if that is okay.

## 2021-07-20 DIAGNOSIS — H8121 Vestibular neuronitis, right ear: Secondary | ICD-10-CM | POA: Diagnosis not present

## 2021-08-25 ENCOUNTER — Other Ambulatory Visit: Payer: Self-pay | Admitting: Family Medicine

## 2021-08-25 DIAGNOSIS — G43009 Migraine without aura, not intractable, without status migrainosus: Secondary | ICD-10-CM

## 2021-11-10 DIAGNOSIS — M6740 Ganglion, unspecified site: Secondary | ICD-10-CM | POA: Diagnosis not present

## 2021-11-12 DIAGNOSIS — U071 COVID-19: Secondary | ICD-10-CM | POA: Diagnosis not present

## 2021-12-19 ENCOUNTER — Encounter: Payer: Self-pay | Admitting: Family Medicine

## 2021-12-19 ENCOUNTER — Ambulatory Visit (INDEPENDENT_AMBULATORY_CARE_PROVIDER_SITE_OTHER): Payer: Medicare Other | Admitting: Family Medicine

## 2021-12-19 VITALS — BP 122/63 | HR 74 | Ht 63.0 in | Wt 133.0 lb

## 2021-12-19 DIAGNOSIS — G8929 Other chronic pain: Secondary | ICD-10-CM

## 2021-12-19 DIAGNOSIS — G43009 Migraine without aura, not intractable, without status migrainosus: Secondary | ICD-10-CM

## 2021-12-19 DIAGNOSIS — E7800 Pure hypercholesterolemia, unspecified: Secondary | ICD-10-CM | POA: Diagnosis not present

## 2021-12-19 DIAGNOSIS — M25561 Pain in right knee: Secondary | ICD-10-CM

## 2021-12-19 DIAGNOSIS — M67432 Ganglion, left wrist: Secondary | ICD-10-CM | POA: Diagnosis not present

## 2021-12-19 DIAGNOSIS — M25562 Pain in left knee: Secondary | ICD-10-CM

## 2021-12-19 MED ORDER — TOPIRAMATE 50 MG PO TABS: 50.0000 mg | ORAL_TABLET | Freq: Two times a day (BID) | ORAL | 1 refills | Status: DC

## 2021-12-19 NOTE — Patient Instructions (Signed)
Please get your Shingrix vaccine and Prevnar 20 vaccine at the pharmacy.   ? ?For the cyst on your left wrist I would recommend that you see our sports medicine provider, Dr. Aundria Mems he is here in our office. ?

## 2021-12-19 NOTE — Progress Notes (Signed)
? ?Established Patient Office Visit ? ?Subjective:  ?Patient ID: Virginia Williams, female    DOB: October 22, 1945  Age: 76 y.o. MRN: [id number] ? ?CC:  ?Chief Complaint  ?Patient presents with  ? Hypertension  ? ? ?HPI ?Virginia Williams presents for  ? ?Migraines -overall she is doing well she reports about 1 headache per month on average she is currently taking Topamax 50 mg twice a day she says she is very consistent with her medications. ? ?She does have a prior history of a meningioma that she had surgically removed and says she does occasionally experience some dizziness and feeling clumsy but it does seem to come and go. ? ?She also has a knot on her left wrist that she would like me to look at.  She says today it is a little bit smaller at other times it is larger sometimes it can actually be sore or painful. ? ?She also reports bilateral knee pain it seems to be constant and its there when she is resting and with activity.  Right now it does not seem to be disruptive to her quality of life.  Her pain is generalized no localized pain or tenderness. ? ?Past Medical History:  ?Diagnosis Date  ? C. difficile colitis   ? post op from cipro  ? Hyperlipidemia   ? Meningioma ([place])   ? Normal stress echocardiogram 09/2005  ? [facility]  ? Staph infection   ? post op  ? ? ?Past Surgical History:  ?Procedure Laterality Date  ? ATRIAL ABLATION SURGERY    ? for AVNRT at [place]  ? BREAST SURGERY    ? breast implants- silicone  ? VENTRAL HERNIA REPAIR  10/2010  ? Dr. Franz Dell  ? ? ?Family History  ?Problem Relation Age of Onset  ? Hypertension Mother   ? ? ?Social History  ? ?Socioeconomic History  ? Marital status: Married  ?  Spouse name: Not on file  ? Number of children: Not on file  ? Years of education: Not on file  ? Highest education level: Not on file  ?Occupational History  ? Not on file  ?Tobacco Use  ? Smoking status: Never  ? Smokeless tobacco: Never  ?Substance and Sexual Activity  ? Alcohol use: No  ?  Drug use: No  ? Sexual activity: Not on file  ?Other Topics Concern  ? Not on file  ?Social History Narrative  ? Not on file  ? ?Social Determinants of Health  ? ?Financial Resource Strain: Not on file  ?Food Insecurity: Not on file  ?Transportation Needs: Not on file  ?Physical Activity: Not on file  ?Stress: Not on file  ?Social Connections: Not on file  ?Intimate Partner Violence: Not on file  ? ? ?Outpatient Medications Prior to Visit  ?Medication Sig Dispense Refill  ? Biotin 5000 MCG CAPS Take by mouth.    ? Multiple Vitamins-Minerals (MULTIVITAMIN ADULT PO) Take by mouth.    ? pravastatin (PRAVACHOL) 10 MG tablet Take 1 tablet (10 mg total) by mouth daily. 90 tablet 3  ? topiramate (TOPAMAX) 50 MG tablet TAKE ONE TABLET BY MOUTH TWICE A DAY 180 tablet 1  ? ?No facility-administered medications prior to visit.  ? ? ?Allergies  ?Allergen Reactions  ? Simvastatin Other (See Comments)  ?  Headache  ? Atorvastatin Other (See Comments)  ?  Myalgias  ? ? ?ROS ?Review of Systems ? ?  ?Objective:  ?  ?Physical Exam ?Constitutional:   ?  Appearance: Normal appearance. She is well-developed.  ?HENT:  ?   Head: Normocephalic and atraumatic.  ?Cardiovascular:  ?   Rate and Rhythm: Normal rate and regular rhythm.  ?   Heart sounds: Normal heart sounds.  ?Pulmonary:  ?   Effort: Pulmonary effort is normal.  ?   Breath sounds: Normal breath sounds.  ?Skin: ?   General: Skin is warm and dry.  ?Neurological:  ?   Mental Status: She is alert and oriented to person, place, and time.  ?Psychiatric:     ?   Behavior: Behavior normal.  ? ? ?BP 122/63   Pulse 74   Ht '5\' 3"'$  (1.6 m)   Wt 133 lb (60.3 kg)   SpO2 96%   BMI 23.56 kg/m?  ?Wt Readings from Last 3 Encounters:  ?12/19/21 133 lb (60.3 kg)  ?06/20/21 131 lb (59.4 kg)  ?12/15/20 141 lb (64 kg)  ? ? ? ?There are no preventive care reminders to display for this patient. ? ?There are no preventive care reminders to display for this patient. ? ?Lab Results  ?Component Value  Date  ? TSH 1.38 12/22/2020  ? ?Lab Results  ?Component Value Date  ? WBC 6.0 12/22/2020  ? HGB 14.0 12/22/2020  ? HCT 43.1 12/22/2020  ? MCV 90.9 12/22/2020  ? PLT 419 (H) 12/22/2020  ? ?Lab Results  ?Component Value Date  ? NA 143 12/22/2020  ? K 4.1 12/22/2020  ? CO2 23 12/22/2020  ? GLUCOSE 87 12/22/2020  ? BUN 21 12/22/2020  ? CREATININE 0.90 12/22/2020  ? BILITOT 0.6 12/22/2020  ? ALKPHOS 67 12/12/2016  ? AST 17 12/22/2020  ? ALT 15 12/22/2020  ? PROT 6.4 12/22/2020  ? ALBUMIN 3.9 12/12/2016  ? CALCIUM 9.1 12/22/2020  ? ?Lab Results  ?Component Value Date  ? CHOL 219 (H) 12/22/2020  ? ?Lab Results  ?Component Value Date  ? HDL 57 12/22/2020  ? ?Lab Results  ?Component Value Date  ? LDLCALC 135 (H) 12/22/2020  ? ?Lab Results  ?Component Value Date  ? TRIG 148 12/22/2020  ? ?Lab Results  ?Component Value Date  ? CHOLHDL 3.8 12/22/2020  ? ?Lab Results  ?Component Value Date  ? HGBA1C 5.3 06/20/2021  ? ? ?  ?Assessment & Plan:  ? ?Problem List Items Addressed This Visit   ? ?  ? Cardiovascular and Mediastinum  ? Migraine headache  ?  Currently well controlled on Topamax 50 mg twice a day she is happy with her current regimen. ?  ?  ? Relevant Orders  ? Lipid Panel w/reflex Direct LDL  ? COMPLETE METABOLIC PANEL WITH GFR  ? CBC  ?  ? Other  ? HYPERCHOLESTEROLEMIA  ?  Due to recheck lipids currently tolerating Pravachol well.  Though she is on a low-dose. ?  ?  ? Relevant Orders  ? Lipid Panel w/reflex Direct LDL  ? COMPLETE METABOLIC PANEL WITH GFR  ? CBC  ? ?Other Visit Diagnoses   ? ? Ganglion cyst of wrist, left    -  Primary  ? Chronic pain of both knees      ? ?  ? ? ?Cyst, left wrist-discussed benign nature of these lesions but if it is painful or tender and it can be aspirated.  Though it does not always necessarily result in 100% resolution so just explained that to her today. ? ?Bilateral knee pain-suspect secondary to osteoarthritis-certainly if at any point she feels like it starting to impair  her  activity or affect her sleep quality were more than happy to work-up further with possible x-rays etc. in the meantime recommend Tylenol and topical Voltaren gel as needed. ? ?No orders of the defined types were placed in this encounter. ? ? ?Follow-up: Return in about 6 months (around 06/20/2022) for Migreaines and medications .  ? ? ?Beatrice Lecher, MD ?

## 2021-12-19 NOTE — Addendum Note (Signed)
Addended by: Teddy Spike on: 12/19/2021 08:35 AM ? ? Modules accepted: Orders ? ?

## 2021-12-19 NOTE — Assessment & Plan Note (Signed)
Due to recheck lipids currently tolerating Pravachol well.  Though she is on a low-dose. ?

## 2021-12-19 NOTE — Assessment & Plan Note (Signed)
Currently well controlled on Topamax 50 mg twice a day she is happy with her current regimen. ?

## 2021-12-20 LAB — CBC
HCT: 42.5 % (ref 35.0–45.0)
Hemoglobin: 13.9 g/dL (ref 11.7–15.5)
MCH: 29.8 pg (ref 27.0–33.0)
MCHC: 32.7 g/dL (ref 32.0–36.0)
MCV: 91.2 fL (ref 80.0–100.0)
MPV: 10 fL (ref 7.5–12.5)
Platelets: 420 10*3/uL — ABNORMAL HIGH (ref 140–400)
RBC: 4.66 10*6/uL (ref 3.80–5.10)
RDW: 12.8 % (ref 11.0–15.0)
WBC: 10 10*3/uL (ref 3.8–10.8)

## 2021-12-20 LAB — COMPLETE METABOLIC PANEL WITH GFR
AG Ratio: 1.8 (calc) (ref 1.0–2.5)
ALT: 15 U/L (ref 6–29)
AST: 17 U/L (ref 10–35)
Albumin: 4.3 g/dL (ref 3.6–5.1)
Alkaline phosphatase (APISO): 64 U/L (ref 37–153)
BUN: 18 mg/dL (ref 7–25)
CO2: 22 mmol/L (ref 20–32)
Calcium: 10.1 mg/dL (ref 8.6–10.4)
Chloride: 111 mmol/L — ABNORMAL HIGH (ref 98–110)
Creat: 0.81 mg/dL (ref 0.60–1.00)
Globulin: 2.4 g/dL (calc) (ref 1.9–3.7)
Glucose, Bld: 87 mg/dL (ref 65–99)
Potassium: 4.3 mmol/L (ref 3.5–5.3)
Sodium: 145 mmol/L (ref 135–146)
Total Bilirubin: 0.5 mg/dL (ref 0.2–1.2)
Total Protein: 6.7 g/dL (ref 6.1–8.1)
eGFR: 75 mL/min/{1.73_m2} (ref >=60)

## 2021-12-20 LAB — LIPID PANEL W/REFLEX DIRECT LDL
Cholesterol: 222 mg/dL — ABNORMAL HIGH (ref <200)
HDL: 62 mg/dL (ref >=50)
LDL Cholesterol (Calc): 130 mg/dL (calc) — ABNORMAL HIGH
Non-HDL Cholesterol (Calc): 160 mg/dL (calc) — ABNORMAL HIGH (ref <130)
Total CHOL/HDL Ratio: 3.6 (calc) (ref <5.0)
Triglycerides: 161 mg/dL — ABNORMAL HIGH (ref <150)

## 2021-12-21 NOTE — Progress Notes (Signed)
Hi [name], your triglycerides and LDL cholesterol just mildly elevated.  Just continue to work on healthy food choices and regular exercise.  Your metabolic panel is normal.  Blood count is also normal platelets still just mildly elevated but stable over the last several years.

## 2022-02-13 DIAGNOSIS — H524 Presbyopia: Secondary | ICD-10-CM | POA: Diagnosis not present

## 2022-02-16 DIAGNOSIS — N952 Postmenopausal atrophic vaginitis: Secondary | ICD-10-CM | POA: Diagnosis not present

## 2022-02-16 DIAGNOSIS — R102 Pelvic and perineal pain: Secondary | ICD-10-CM | POA: Diagnosis not present

## 2022-02-16 DIAGNOSIS — N950 Postmenopausal bleeding: Secondary | ICD-10-CM | POA: Diagnosis not present

## 2022-04-23 DIAGNOSIS — J069 Acute upper respiratory infection, unspecified: Secondary | ICD-10-CM | POA: Diagnosis not present

## 2022-04-23 DIAGNOSIS — Z20822 Contact with and (suspected) exposure to covid-19: Secondary | ICD-10-CM | POA: Diagnosis not present

## 2022-04-23 DIAGNOSIS — J029 Acute pharyngitis, unspecified: Secondary | ICD-10-CM | POA: Diagnosis not present

## 2022-04-30 DIAGNOSIS — Z1231 Encounter for screening mammogram for malignant neoplasm of breast: Secondary | ICD-10-CM | POA: Diagnosis not present

## 2022-04-30 LAB — HM MAMMOGRAPHY

## 2022-06-20 ENCOUNTER — Telehealth: Payer: Self-pay | Admitting: Family Medicine

## 2022-06-20 DIAGNOSIS — G43009 Migraine without aura, not intractable, without status migrainosus: Secondary | ICD-10-CM

## 2022-06-20 MED ORDER — TOPIRAMATE 50 MG PO TABS: 50.0000 mg | ORAL_TABLET | Freq: Two times a day (BID) | ORAL | 0 refills | Status: AC

## 2022-06-20 NOTE — Telephone Encounter (Signed)
I called patient. I advised she is due for a follow up appointment. She states she will call to schedule once she is back from traveling. I sent in a 30 day supply.

## 2022-06-20 NOTE — Telephone Encounter (Signed)
Per patient asking for refill to be sent over to [facility] for Topiramate 50 MG.  She said if you can just call that in she will need it soon.

## 2022-06-25 ENCOUNTER — Ambulatory Visit: Payer: Medicare Other | Admitting: Family Medicine

## 2022-07-16 ENCOUNTER — Encounter: Payer: Self-pay | Admitting: Family Medicine

## 2022-07-16 NOTE — Progress Notes (Signed)
08 

## 2022-07-23 DIAGNOSIS — Z7689 Persons encountering health services in other specified circumstances: Secondary | ICD-10-CM | POA: Diagnosis not present

## 2022-07-23 DIAGNOSIS — M810 Age-related osteoporosis without current pathological fracture: Secondary | ICD-10-CM | POA: Diagnosis not present

## 2022-07-23 DIAGNOSIS — R413 Other amnesia: Secondary | ICD-10-CM | POA: Diagnosis not present

## 2022-07-23 DIAGNOSIS — Z86018 Personal history of other benign neoplasm: Secondary | ICD-10-CM | POA: Diagnosis not present

## 2022-07-23 DIAGNOSIS — I10 Essential (primary) hypertension: Secondary | ICD-10-CM | POA: Diagnosis not present

## 2022-07-23 DIAGNOSIS — W3400XA Accidental discharge from unspecified firearms or gun, initial encounter: Secondary | ICD-10-CM | POA: Diagnosis not present

## 2022-11-27 ENCOUNTER — Telehealth: Payer: Self-pay | Admitting: Family Medicine

## 2022-11-27 NOTE — Telephone Encounter (Signed)
Called patient to schedule Medicare Annual Wellness Visit (AWV). Left message for patient to call back and schedule Medicare Annual Wellness Visit (AWV).  Last date of AWV: Never  Please schedule an appointment at any time with Nurse Health Advisor.  If any questions, please contact me at [phone].  Thank you ,  Morgan Jessup Patient Access Advocate II Direct Dial: [phone]   

## 2023-03-07 ENCOUNTER — Encounter: Payer: Self-pay | Admitting: *Deleted

## 2023-03-07 ENCOUNTER — Telehealth: Payer: Self-pay | Admitting: *Deleted

## 2023-03-07 NOTE — Transitions of Care (Post Inpatient/ED Visit) (Signed)
   03/07/2023  Name: Virginia Williams MRN: [id number] DOB: May 06, 1946  Today's TOC FU Call Status: Today's TOC FU Call Status:: Successful TOC FU Call Competed TOC FU Call Complete Date: 03/07/23 (full TOC call deferred accoridng to patient report she has switched PCPs and is no longer with [facility] PCP)  Transition Care Management Follow-up Telephone Call Date of Discharge: 03/05/23 Discharge Facility: Other (Non-[facility] Facility) Name of Other ([facility]) Discharge Facility: [facility] Type of Discharge: Inpatient Admission Primary Inpatient Discharge Diagnosis:: vestibular neuronitis; nausea/ vomiting/ abdominal pain How have you been since you were released from the hospital?: Better ("I am doing better and I have already scheduled my appointment with my PCP-- my PCP is not with [facility] anymore.  I am now going to Dr. Sedalia Muta who is either with [facility] or [facility], I can't remember which")  Items Reviewed:   Medications Reviewed Today: Medications Reviewed Today     Reviewed by Michaela Corner, RN (Registered Nurse) on 03/07/23 at 1416  Med List Status: <None>   Medication Order Taking? Sig Documenting Provider Last Dose Status Informant  Biotin 5000 MCG CAPS [id number] No Take by mouth. [provider] Taking Active            Med Note Laureen Ochs, TONYA L   Tue Dec 19, 2021  8:12 AM)    Multiple Vitamins-Minerals (MULTIVITAMIN ADULT PO) [id number] No Take by mouth. [provider] Taking Active   pravastatin (PRAVACHOL) 10 MG tablet [id number] No Take 1 tablet (10 mg total) by mouth daily. Agapito Games, MD Taking Active   topiramate (TOPAMAX) 50 MG tablet [id number]  Take 1 tablet (50 mg total) by mouth 2 (two) times daily. Agapito Games, MD  Active   Med List Note Deno Etienne, [place] 06/22/21 1025): nd she state            Home Care and Equipment/Supplies:    Functional Questionnaire:    Follow up appointments reviewed:    Caryl Pina, RN, BSN,  [employer] Alumnus RN CM Care Coordination/ Transition of Care- [facility] Care Management [phone]: direct office
